# Patient Record
Sex: Female | Born: 1995 | Race: White | Hispanic: No | Marital: Single | Smoking: Current every day smoker
Health system: Southern US, Community
[De-identification: ages and names within clinical notes are randomized; demographics above are authoritative.]

## PROBLEM LIST (undated history)

## (undated) ENCOUNTER — Inpatient Hospital Stay (HOSPITAL_COMMUNITY): Payer: Self-pay

## (undated) DIAGNOSIS — F419 Anxiety disorder, unspecified: Secondary | ICD-10-CM

## (undated) DIAGNOSIS — R51 Headache: Secondary | ICD-10-CM

## (undated) DIAGNOSIS — K219 Gastro-esophageal reflux disease without esophagitis: Secondary | ICD-10-CM

## (undated) DIAGNOSIS — R519 Headache, unspecified: Secondary | ICD-10-CM

## (undated) HISTORY — PX: FRACTURE SURGERY: SHX138

## (undated) HISTORY — DX: Gastro-esophageal reflux disease without esophagitis: K21.9

## (undated) HISTORY — PX: ORTHOPEDIC SURGERY: SHX850

---
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
## 2015-01-01 ENCOUNTER — Encounter (HOSPITAL_COMMUNITY): Payer: Self-pay | Admitting: Emergency Medicine

## 2015-01-01 ENCOUNTER — Emergency Department (INDEPENDENT_AMBULATORY_CARE_PROVIDER_SITE_OTHER)
Admission: EM | Admit: 2015-01-01 | Discharge: 2015-01-01 | Disposition: A | Discharge status: Home / Self Care | Payer: Medicaid Other | Source: Home / Self Care | Attending: Family Medicine | Admitting: Family Medicine

## 2015-01-01 DIAGNOSIS — B86 Scabies: Secondary | ICD-10-CM

## 2015-01-01 MED ORDER — PERMETHRIN 5 % EX CREA: TOPICAL_CREAM | CUTANEOUS | Status: DC

## 2015-01-01 NOTE — Discharge Instructions (Signed)

## 2015-01-01 NOTE — ED Notes (Signed)
C/o rash all over body onset x1 week Denies fevers, chills Reports recent cat in the home A&O x4... No acute distress.

## 2015-01-01 NOTE — ED Provider Notes (Signed)
CSN: [id number]     Arrival date & time 01/01/15  1523 History   First MD Initiated Contact with Patient 01/01/15 1631     Chief Complaint  Patient presents with  . Rash   (Consider location/radiation/quality/duration/timing/severity/associated sxs/prior Treatment) HPI Comments: 19 year old female complaining of a rash for one month. Started when she moved to [place] and was sleeping on an old mattress. It tends to come and go. She takes Zyrtec which helps relieve the itching. The rash, which consist of pinpoint papules which are flesh-colored and red initially occurred on the hand in between the web spaces. They also occurred on the feet, between the toes and then on the arms and legs and torso.  Patient is a 19 y.o. female presenting with rash.  Rash   Past Medical History  Diagnosis Date  . Asthma   . Anxiety   . Headache    Past Surgical History  Procedure Laterality Date  . Orthopedic surgery    . Fracture surgery     No family history on file. Social History  Substance Use Topics  . Smoking status: Current Every Day Smoker -- 1.00 packs/day for 3 years    Types: Cigarettes  . Smokeless tobacco: None  . Alcohol Use: No   OB History    Gravida Para Term Preterm AB TAB SAB Ectopic Multiple Living   0 0 0 0 0 0 0 0       Review of Systems  Constitutional: Negative.   HENT: Negative.   Respiratory: Negative.   Genitourinary: Negative.   Skin: Positive for rash.  Neurological: Negative.     Allergies  Benadryl; Diphenhydramine; and Peanut-containing drug products  Home Medications   Prior to Admission medications   Medication Sig Start Date End Date Taking? Authorizing Provider  Acetaminophen (TYLENOL ARTHRITIS EXT RELIEF PO) Take 2 tablets by mouth daily as needed.    Historical Provider, MD  ciprofloxacin (CIPRO) 500 MG tablet Take 1 tablet (500 mg total) by mouth 2 (two) times daily. 05/13/14   Armando Reichert, CNM  clonazePAM (KLONOPIN) 0.5 MG tablet Take  0.5 mg by mouth daily as needed for anxiety.    Historical Provider, MD  cyclobenzaprine (FLEXERIL) 5 MG tablet Take 1 tablet (5 mg total) by mouth 2 (two) times daily as needed for muscle spasms. 09/15/14   Marlon Pel, PA-C  HYDROcodone-acetaminophen (NORCO/VICODIN) 5-325 MG per tablet Take 1-2 tablets by mouth every 6 (six) hours as needed. 09/18/14   Eliette Laisure, PA-C  naproxen sodium (ANAPROX) 220 MG tablet Take 660 mg by mouth 2 (two) times daily as needed (headaches).    Historical Provider, MD  permethrin (ELIMITE) 5 % cream Apply from head to toe. Leave on for 8 hours then rinse off. Repeat in 1 week. 01/01/15   Hayden Rasmussen, NP  traMADol (ULTRAM) 50 MG tablet Take 1 tablet (50 mg total) by mouth every 6 (six) hours as needed. 09/15/14   Marlon Pel, PA-C   Meds Ordered and Administered this Visit  Medications - No data to display  BP 108/68 mmHg  Pulse 78  Temp(Src) 98.6 F (37 C) (Oral)  SpO2 98%  LMP 12/27/2014 No data found.   Physical Exam  Constitutional: She is oriented to person, place, and time. She appears well-developed and well-nourished. No distress.  HENT:  Mouth/Throat: Oropharynx is clear and moist. No oropharyngeal exudate.  Eyes: Conjunctivae and EOM are normal.  Neck: Normal range of motion. Neck supple.  Cardiovascular: Normal rate.  Pulmonary/Chest: Effort normal and breath sounds normal. No respiratory distress. She has no wheezes.  Musculoskeletal: She exhibits no edema.  Neurological: She is alert and oriented to person, place, and time. She exhibits normal muscle tone.  Skin: Skin is warm and dry. Rash noted.  Rash as described in history of present illness. Consistent with scabies. Although the patient was complaining of hives there are no lesions that are consistent with urticaria.  Psychiatric: She has a normal mood and affect.  Nursing note and vitals reviewed.   ED Course  Procedures (including critical care time)  Labs Review Labs  Reviewed - No data to display  Imaging Review No results found.   Visual Acuity Review  Right Eye Distance:   Left Eye Distance:   Bilateral Distance:    Right Eye Near:   Left Eye Near:    Bilateral Near:         MDM   1. Scabies    Elimite as directed, repeat in 7 days Environmental cleaning and this in to station methods as discussed and per written instructions Follow-up with your PCP as needed.    Hayden Rasmussenavid Raygan Skarda, NP 01/01/15 [phone]

## 2015-01-15 ENCOUNTER — Encounter (HOSPITAL_COMMUNITY): Payer: Self-pay | Admitting: Emergency Medicine

## 2015-01-15 ENCOUNTER — Emergency Department (INDEPENDENT_AMBULATORY_CARE_PROVIDER_SITE_OTHER)
Admission: EM | Admit: 2015-01-15 | Discharge: 2015-01-15 | Disposition: A | Discharge status: Home / Self Care | Payer: Medicaid Other | Source: Home / Self Care

## 2015-01-15 DIAGNOSIS — J039 Acute tonsillitis, unspecified: Secondary | ICD-10-CM

## 2015-01-15 LAB — POCT RAPID STREP A: Streptococcus, Group A Screen (Direct): NEGATIVE

## 2015-01-15 MED ORDER — ACETAMINOPHEN 160 MG/5ML PO SOLN
ORAL | Status: AC
  Filled 2015-01-15: qty 20.3

## 2015-01-15 MED ORDER — AMOXICILLIN 250 MG/5ML PO SUSR: 500.0000 mg | Freq: Two times a day (BID) | ORAL | Status: DC

## 2015-01-15 MED ORDER — AMOXICILLIN 400 MG/5ML PO SUSR: 400.0000 mg | Freq: Three times a day (TID) | ORAL | Status: AC

## 2015-01-15 MED ORDER — ACETAMINOPHEN 160 MG/5ML PO SOLN
15.0000 mg/kg | Freq: Once | ORAL | Status: AC
  Administered 2015-01-15: 650 mg via ORAL

## 2015-01-15 MED ORDER — DEXAMETHASONE 1 MG/ML PO CONC
10.0000 mg | Freq: Once | ORAL | Status: AC
  Administered 2015-01-15: 10 mg via ORAL

## 2015-01-15 MED ORDER — DEXAMETHASONE SODIUM PHOSPHATE 10 MG/ML IJ SOLN
INTRAMUSCULAR | Status: AC
  Filled 2015-01-15: qty 1

## 2015-01-15 NOTE — ED Notes (Signed)
Complains of extremely swollen and painful throat.  Onset this morning of symptoms.  Reports she has extreme pain with swallowing and has to spit saliva

## 2015-01-15 NOTE — Discharge Instructions (Signed)
Tonsillitis Tonsillitis is an infection of the throat. This infection causes the tonsils to become red, tender, and puffy (swollen). Tonsils are groups of tissue at the back of your throat. If bacteria caused your infection, antibiotic medicine will be given to you. Sometimes symptoms of tonsillitis can be relieved with the use of steroid medicine. If your tonsillitis is severe and happens often, you may need to get your tonsils removed (tonsillectomy). HOME CARE   Rest and sleep often.  Drink enough fluids to keep your pee (urine) clear or pale yellow.  While your throat is sore, eat soft or liquid foods like:  Soup.  Ice cream.  Instant breakfast drinks.  Eat frozen ice pops.  Gargle with a warm or cold liquid to help soothe the throat. Gargle with a water and salt mix. Mix 1/4 teaspoon of salt and 1/4 teaspoon of baking soda in 1 cup of water.  Only take medicines as told by your doctor.  If you are given medicines (antibiotics), take them as told. Finish them even if you start to feel better. GET HELP IF:  You have large, tender lumps in your neck.  You have a rash.  You cough up green, yellow-brown, or bloody fluid.  You cannot swallow liquids or food for 24 hours.  You notice that only one of your tonsils is swollen. GET HELP RIGHT AWAY IF:   You throw up (vomit).  You have a very bad headache.  You have a stiff neck.  You have chest pain.  You have trouble breathing or swallowing.  You have bad throat pain, drooling, or your voice changes.  You have bad pain not helped by medicine.  You cannot fully open your mouth.  You have redness, puffiness, or bad pain in the neck.  You have a fever. MAKE SURE YOU:   Understand these instructions.  Will watch your condition.  Will get help right away if you are not doing well or get worse.   This information is not intended to replace advice given to you by your health care provider. Make sure you discuss any  questions you have with your health care provider.   Document Released: 08/15/2007 Document Revised: 03/03/2013 Document Reviewed: 08/15/2012 Elsevier Interactive Patient Education [employer]2016 [employer] Your strep test today is negative.  The steroid medication that you were given tonight will remain in your system for at 3 days. There is no need for a prescription for this medication.  A prescription for Amoxil liquid has been sent to your pharmacy.  Drink lots of fluids.  You may take liquid Tylenol every 4 hours or liquid ibuprofen every 6 hours.

## 2015-01-15 NOTE — ED Provider Notes (Signed)
CSN: [id number]     Arrival date & time 01/15/15  1804 History   None    No chief complaint on file.  (Consider location/radiation/quality/duration/timing/severity/associated sxs/prior Treatment) HPI History obtained from patient:   LOCATION:throat SEVERITY:10 DURATION:1 day CONTEXT:sudden onset  QUALITY:scratchy MODIFYING FACTORS:unable to swallow so no analgesia ASSOCIATED SYMPTOMS:not feeling well, fever TIMING:constant  Past Medical History  Diagnosis Date  . Asthma   . Anxiety   . Headache    Past Surgical History  Procedure Laterality Date  . Orthopedic surgery    . Fracture surgery     No family history on file. Social History  Substance Use Topics  . Smoking status: Current Every Day Smoker -- 1.00 packs/day for 3 years    Types: Cigarettes  . Smokeless tobacco: Not on file  . Alcohol Use: No   OB History    Gravida Para Term Preterm AB TAB SAB Ectopic Multiple Living   0 0 0 0 0 0 0 0       Review of Systems ROS +'ve sore throat  DENIES; CHANGE IN ACTIVITY, CONGESTION, HEADACHE, CHEST PAIN, ABDOMINAL PAIN, SHORTNESS OF BREATH, WHEEZING, EXCESSIVE THIRST OR URINATION, SKIN RASH, DIFFICULTY WITH URINATION, DYSURIA, AGITATION, BALANCE ISSUES   Allergies  Benadryl; Diphenhydramine; and Peanut-containing drug products  Home Medications   Prior to Admission medications   Medication Sig Start Date End Date Taking? Authorizing Provider  Acetaminophen (TYLENOL ARTHRITIS EXT RELIEF PO) Take 2 tablets by mouth daily as needed.    Historical Provider, MD  ciprofloxacin (CIPRO) 500 MG tablet Take 1 tablet (500 mg total) by mouth 2 (two) times daily. 05/13/14   Armando ReichertHeather D Hogan, CNM  clonazePAM (KLONOPIN) 0.5 MG tablet Take 0.5 mg by mouth daily as needed for anxiety.    Historical Provider, MD  cyclobenzaprine (FLEXERIL) 5 MG tablet Take 1 tablet (5 mg total) by mouth 2 (two) times daily as needed for muscle spasms. 09/15/14   Marlon Peliffany Greene, PA-C   HYDROcodone-acetaminophen (NORCO/VICODIN) 5-325 MG per tablet Take 1-2 tablets by mouth every 6 (six) hours as needed. 09/18/14   Cherica Laisure, PA-C  naproxen sodium (ANAPROX) 220 MG tablet Take 660 mg by mouth 2 (two) times daily as needed (headaches).    Historical Provider, MD  permethrin (ELIMITE) 5 % cream Apply from head to toe. Leave on for 8 hours then rinse off. Repeat in 1 week. 01/01/15   Hayden Rasmussenavid Mabe, NP  traMADol (ULTRAM) 50 MG tablet Take 1 tablet (50 mg total) by mouth every 6 (six) hours as needed. 09/15/14   Marlon Peliffany Greene, PA-C   Meds Ordered and Administered this Visit   Medications  acetaminophen (TYLENOL) solution 15 mg/kg (not administered)    LMP 12/27/2014 No data found.   Physical Exam NURSES NOTES AND VITAL SIGNS REVIEWED. CONSTITUTIONAL: Well developed, well nourished, no acute distress HEENT: normocephalic, atraumatic, Throat injected without exudate, TM's clear b/l EYES: Conjunctiva normal NECK:normal ROM, supple PULMONARY:No respiratory distress, normal effort, Lungs: CTAb/l CARDIOVASCULAR: RRR, no murmur ABDOMEN: soft, ND, NT, +'ve BS MUSCULOSKELETAL: Normal ROM of all extremities SKIN: warm and dry without rash PSYCHIATRIC: Mood and affect normal  ED Course  Procedures (including critical care time)  Labs Review Labs Reviewed - No data to display  Imaging Review No results found.   Visual Acuity Review  Right Eye Distance:   Left Eye Distance:   Bilateral Distance:    Right Eye Near:   Left Eye Near:    Bilateral Near:  MDM  No diagnosis found. Treatment options discussed including:RBS  RBS is negative Pt is stable for discharge at this time. Rx (amoxil ) dexamethasone given prior to discharge. Continue symptomatic treatment at home. Advised to follow up with PCP or return to the clinic if there are new or worsening of symptoms.  All questions answered.  Instructions of care provided.       Tharon Aquas,  PA 01/15/15 [phone]

## 2015-01-19 LAB — CULTURE, GROUP A STREP

## 2015-01-28 ENCOUNTER — Emergency Department (HOSPITAL_COMMUNITY)
Admission: EM | Admit: 2015-01-28 | Discharge: 2015-01-28 | Disposition: A | Discharge status: Home / Self Care | Payer: Medicaid Other | Attending: Emergency Medicine | Admitting: Emergency Medicine

## 2015-01-28 ENCOUNTER — Encounter (HOSPITAL_COMMUNITY): Payer: Self-pay | Admitting: Emergency Medicine

## 2015-01-28 DIAGNOSIS — F419 Anxiety disorder, unspecified: Secondary | ICD-10-CM | POA: Insufficient documentation

## 2015-01-28 DIAGNOSIS — Z79899 Other long term (current) drug therapy: Secondary | ICD-10-CM | POA: Diagnosis not present

## 2015-01-28 DIAGNOSIS — D509 Iron deficiency anemia, unspecified: Secondary | ICD-10-CM | POA: Insufficient documentation

## 2015-01-28 DIAGNOSIS — Z3202 Encounter for pregnancy test, result negative: Secondary | ICD-10-CM | POA: Diagnosis not present

## 2015-01-28 DIAGNOSIS — D72829 Elevated white blood cell count, unspecified: Secondary | ICD-10-CM

## 2015-01-28 DIAGNOSIS — J45909 Unspecified asthma, uncomplicated: Secondary | ICD-10-CM | POA: Diagnosis not present

## 2015-01-28 DIAGNOSIS — J029 Acute pharyngitis, unspecified: Secondary | ICD-10-CM | POA: Insufficient documentation

## 2015-01-28 DIAGNOSIS — F17210 Nicotine dependence, cigarettes, uncomplicated: Secondary | ICD-10-CM | POA: Insufficient documentation

## 2015-01-28 LAB — CBC WITH DIFFERENTIAL/PLATELET
BASOS ABS: 0 10*3/uL (ref 0.0–0.1)
Basophils Relative: 0 %
EOS PCT: 0 %
Eosinophils Absolute: 0 10*3/uL (ref 0.0–0.7)
HEMATOCRIT: 30.2 % — AB (ref 36.0–46.0)
Hemoglobin: 9.2 g/dL — ABNORMAL LOW (ref 12.0–15.0)
LYMPHS ABS: 1.3 10*3/uL (ref 0.7–4.0)
Lymphocytes Relative: 6 %
MCH: 24.9 pg — ABNORMAL LOW (ref 26.0–34.0)
MCHC: 30.5 g/dL (ref 30.0–36.0)
MCV: 81.6 fL (ref 78.0–100.0)
MONO ABS: 1.1 10*3/uL — AB (ref 0.1–1.0)
MONOS PCT: 5 %
NEUTROS PCT: 89 %
Neutro Abs: 19.8 10*3/uL — ABNORMAL HIGH (ref 1.7–7.7)
PLATELETS: 300 10*3/uL (ref 150–400)
RBC: 3.7 MIL/uL — AB (ref 3.87–5.11)
RDW: 16.3 % — AB (ref 11.5–15.5)
WBC: 22.2 10*3/uL — AB (ref 4.0–10.5)

## 2015-01-28 LAB — URINE MICROSCOPIC-ADD ON

## 2015-01-28 LAB — RAPID STREP SCREEN (MED CTR MEBANE ONLY): Streptococcus, Group A Screen (Direct): NEGATIVE

## 2015-01-28 LAB — URINALYSIS, ROUTINE W REFLEX MICROSCOPIC
BILIRUBIN URINE: NEGATIVE
Glucose, UA: NEGATIVE mg/dL
Hgb urine dipstick: NEGATIVE
Ketones, ur: NEGATIVE mg/dL
LEUKOCYTES UA: NEGATIVE
NITRITE: NEGATIVE
PH: 7.5 (ref 5.0–8.0)
Protein, ur: NEGATIVE mg/dL
SPECIFIC GRAVITY, URINE: 1.018 (ref 1.005–1.030)

## 2015-01-28 LAB — BASIC METABOLIC PANEL
ANION GAP: 7 (ref 5–15)
BUN: 8 mg/dL (ref 6–20)
CALCIUM: 8.9 mg/dL (ref 8.9–10.3)
CO2: 28 mmol/L (ref 22–32)
Chloride: 104 mmol/L (ref 101–111)
Creatinine, Ser: 0.55 mg/dL (ref 0.44–1.00)
Glucose, Bld: 82 mg/dL (ref 65–99)
POTASSIUM: 3.8 mmol/L (ref 3.5–5.1)
Sodium: 139 mmol/L (ref 135–145)

## 2015-01-28 LAB — POC URINE PREG, ED: PREG TEST UR: NEGATIVE

## 2015-01-28 LAB — MONONUCLEOSIS SCREEN: Mono Screen: NEGATIVE

## 2015-01-28 MED ORDER — CEPHALEXIN 500 MG PO CAPS: 500.0000 mg | ORAL_CAPSULE | Freq: Four times a day (QID) | ORAL | Status: DC

## 2015-01-28 MED ORDER — DEXAMETHASONE SODIUM PHOSPHATE 10 MG/ML IJ SOLN
10.0000 mg | Freq: Once | INTRAMUSCULAR | Status: AC
  Administered 2015-01-28: 10 mg via INTRAMUSCULAR
  Filled 2015-01-28: qty 1

## 2015-01-28 MED ORDER — ACETAMINOPHEN-CODEINE 120-12 MG/5ML PO SUSP: 5.0000 mL | Freq: Four times a day (QID) | ORAL | Status: DC | PRN

## 2015-01-28 NOTE — ED Provider Notes (Signed)
CSN: [id number]     Arrival date & time 01/28/15  1942 History  By signing my name below, I, Evon Slack, attest that this documentation has been prepared under the direction and in the presence of Teressa Lower, NP. Electronically Signed: Evon Slack, ED Scribe. 01/28/2015. 9:07 PM.     Chief Complaint  Patient presents with  . Sore Throat  . Fall    The history is provided by the patient. No language interpreter was used.   HPI Comments: [name] is a 19 y.o. female who presents to the Emergency Department complaining of sore throat onset 2 weeks prior. Pt states that she has also had intermittent fever. Pt states that her pain is worse when swallowing. Pt states that she was recently diagnosed with strep throat. She states that she was prescribed amoxicillin which has not provided any relief. Pt doesn't report drooling, trouble swallowing or SOB.    Past Medical History  Diagnosis Date  . Asthma   . Anxiety   . Headache    Past Surgical History  Procedure Laterality Date  . Orthopedic surgery    . Fracture surgery     No family history on file. Social History  Substance Use Topics  . Smoking status: Current Every Day Smoker -- 0.00 packs/day for 3 years    Types: Cigarettes  . Smokeless tobacco: None  . Alcohol Use: No   OB History    Gravida Para Term Preterm AB TAB SAB Ectopic Multiple Living   0 0 0 0 0 0 0 0        Review of Systems  Constitutional: Positive for fever.  HENT: Positive for sore throat. Negative for drooling and trouble swallowing.   Respiratory: Negative for shortness of breath.   All other systems reviewed and are negative.    Allergies  Benadryl; Diphenhydramine; and Peanut-containing drug products  Home Medications   Prior to Admission medications   Medication Sig Start Date End Date Taking? Authorizing Provider  Acetaminophen (TYLENOL ARTHRITIS EXT RELIEF PO) Take 2 tablets by mouth daily as needed.    Historical  Provider, MD  ciprofloxacin (CIPRO) 500 MG tablet Take 1 tablet (500 mg total) by mouth 2 (two) times daily. Patient not taking: Reported on 01/15/2015 05/13/14   Armando Reichert, CNM  clonazePAM (KLONOPIN) 0.5 MG tablet Take 0.5 mg by mouth daily as needed for anxiety.    Historical Provider, MD  cyclobenzaprine (FLEXERIL) 5 MG tablet Take 1 tablet (5 mg total) by mouth 2 (two) times daily as needed for muscle spasms. Patient not taking: Reported on 01/15/2015 09/15/14   Marlon Pel, PA-C  HYDROcodone-acetaminophen (NORCO/VICODIN) 5-325 MG per tablet Take 1-2 tablets by mouth every 6 (six) hours as needed. Patient not taking: Reported on 01/15/2015 09/18/14   Santiago Glad, PA-C  naproxen sodium (ANAPROX) 220 MG tablet Take 660 mg by mouth 2 (two) times daily as needed (headaches).    Historical Provider, MD  permethrin (ELIMITE) 5 % cream Apply from head to toe. Leave on for 8 hours then rinse off. Repeat in 1 week. Patient not taking: Reported on 01/15/2015 01/01/15   Hayden Rasmussen, NP  traMADol (ULTRAM) 50 MG tablet Take 1 tablet (50 mg total) by mouth every 6 (six) hours as needed. Patient not taking: Reported on 01/15/2015 09/15/14   Marlon Pel, PA-C   BP 111/68 mmHg  Pulse 103  Temp(Src) 99.4 F (37.4 C) (Oral)  Resp 16  Ht  (1.549 m)  Wt 90 lb (40.824 kg)  BMI 17.01 kg/m2  SpO2 99%  LMP 12/04/2014   Physical Exam  Constitutional: She is oriented to person, place, and time. She appears well-developed and well-nourished. No distress.  HENT:  Head: Normocephalic and atraumatic.  Right Ear: External ear normal.  Left Ear: External ear normal.  Mouth/Throat: Oropharyngeal exudate and posterior oropharyngeal erythema present. No tonsillar abscesses.  Eyes: Conjunctivae and EOM are normal.  Neck: Neck supple. No tracheal deviation present.  Cardiovascular: Normal rate.   Pulmonary/Chest: Effort normal. No respiratory distress.  Musculoskeletal: Normal range of motion.   Neurological: She is alert and oriented to person, place, and time.  Skin: Skin is warm and dry.  Psychiatric: She has a normal mood and affect. Her behavior is normal.  Nursing note and vitals reviewed.   ED Course  Procedures (including critical care time) DIAGNOSTIC STUDIES: Oxygen Saturation is 99% on RA, normal by my interpretation.    COORDINATION OF CARE: 9:12 PM-Discussed treatment plan with pt at bedside and pt agreed to plan.     Labs Review Labs Reviewed  URINALYSIS, ROUTINE W REFLEX MICROSCOPIC (NOT AT [facility]) - Abnormal; Notable for the following:    APPearance TURBID (*)    All other components within normal limits  CBC WITH DIFFERENTIAL/PLATELET - Abnormal; Notable for the following:    WBC 22.2 (*)    RBC 3.70 (*)    Hemoglobin 9.2 (*)    HCT 30.2 (*)    MCH 24.9 (*)    RDW 16.3 (*)    All other components within normal limits  URINE MICROSCOPIC-ADD ON - Abnormal; Notable for the following:    Squamous Epithelial / LPF 0-5 (*)    Bacteria, UA MANY (*)    All other components within normal limits  RAPID STREP SCREEN (NOT AT [facility])  CULTURE, GROUP A STREP  BASIC METABOLIC PANEL  POC URINE PREG, ED    Imaging Review No results found.     EKG Interpretation None      MDM   Final diagnoses:  Pharyngitis  Leukocytosis  Iron deficiency anemia    No sign of pta. Treated with no keflex and tylenol with codeine. Pt is okay to follow up with ent as needed  I personally performed the services described in this documentation, which was scribed in my presence. The recorded information has been reviewed and is accurate.      Teressa LowerVrinda Allaina Brotzman, NP 01/28/15 2350  Tilden FossaElizabeth Rees, MD 01/29/15 [phone]

## 2015-01-28 NOTE — ED Notes (Signed)
Pt. reports sore throat with swelling for 2 weeks seen at urgent care diagnosed with tonsillitis prescribed with antibiotic with no improvement , airway intact /respirations unlabored , pt. added dizziness/lightheaded today and fell with no injury.

## 2015-01-28 NOTE — Discharge Instructions (Signed)

## 2015-02-01 LAB — CULTURE, GROUP A STREP: Strep A Culture: NEGATIVE

## 2015-03-10 ENCOUNTER — Emergency Department (HOSPITAL_COMMUNITY)
Admission: EM | Admit: 2015-03-10 | Discharge: 2015-03-10 | Disposition: A | Discharge status: Home / Self Care | Payer: Medicaid Other | Attending: Emergency Medicine | Admitting: Emergency Medicine

## 2015-03-10 ENCOUNTER — Encounter (HOSPITAL_COMMUNITY): Payer: Self-pay | Admitting: *Deleted

## 2015-03-10 DIAGNOSIS — F419 Anxiety disorder, unspecified: Secondary | ICD-10-CM | POA: Insufficient documentation

## 2015-03-10 DIAGNOSIS — J069 Acute upper respiratory infection, unspecified: Secondary | ICD-10-CM | POA: Diagnosis not present

## 2015-03-10 DIAGNOSIS — Z792 Long term (current) use of antibiotics: Secondary | ICD-10-CM | POA: Insufficient documentation

## 2015-03-10 DIAGNOSIS — J45901 Unspecified asthma with (acute) exacerbation: Secondary | ICD-10-CM | POA: Insufficient documentation

## 2015-03-10 DIAGNOSIS — R05 Cough: Secondary | ICD-10-CM | POA: Diagnosis present

## 2015-03-10 DIAGNOSIS — F17210 Nicotine dependence, cigarettes, uncomplicated: Secondary | ICD-10-CM | POA: Diagnosis not present

## 2015-03-10 NOTE — Discharge Instructions (Signed)
[name],  Nice meeting you! Please follow-up with your primary care provider. If you do not have one, please see the following regarding available providers in the area. Return to the emergency department if you develop fevers, neck stiffness, increasing headache. You may take ibuprofen for headaches and facial pain. You can purchase over the counter tessalon perles and/or robitussin cough syrup. Mucinex also works well. Feel better soon!  S. Lane HackerNicole Fredi Hurtado, PA-C   Upper Respiratory Infection, Adult Most upper respiratory infections (URIs) are a viral infection of the air passages leading to the lungs. A URI affects the nose, throat, and upper air passages. The most common type of URI is nasopharyngitis and is typically referred to as "the common cold." URIs run their course and usually go away on their own. Most of the time, a URI does not require medical attention, but sometimes a bacterial infection in the upper airways can follow a viral infection. This is called a secondary infection. Sinus and middle ear infections are common types of secondary upper respiratory infections. Bacterial pneumonia can also complicate a URI. A URI can worsen asthma and chronic obstructive pulmonary disease (COPD). Sometimes, these complications can require emergency medical care and may be life threatening.  CAUSES Almost all URIs are caused by viruses. A virus is a type of germ and can spread from one person to another.  RISKS FACTORS You may be at risk for a URI if:   You smoke.   You have chronic heart or lung disease.  You have a weakened defense (immune) system.   You are very young or very old.   You have nasal allergies or asthma.  You work in crowded or poorly ventilated areas.  You work in health care facilities or schools. SIGNS AND SYMPTOMS  Symptoms typically develop 2-3 days after you come in contact with a cold virus. Most viral URIs last 7-10 days. However, viral URIs from  the influenza virus (flu virus) can last 14-18 days and are typically more severe. Symptoms may include:   Runny or stuffy (congested) nose.   Sneezing.   Cough.   Sore throat.   Headache.   Fatigue.   Fever.   Loss of appetite.   Pain in your forehead, behind your eyes, and over your cheekbones (sinus pain).  Muscle aches.  DIAGNOSIS  Your health care provider may diagnose a URI by:  Physical exam.  Tests to check that your symptoms are not due to another condition such as:  Strep throat.  Sinusitis.  Pneumonia.  Asthma. TREATMENT  A URI goes away on its own with time. It cannot be cured with medicines, but medicines may be prescribed or recommended to relieve symptoms. Medicines may help:  Reduce your fever.  Reduce your cough.  Relieve nasal congestion. HOME CARE INSTRUCTIONS   Take medicines only as directed by your health care provider.   Gargle warm saltwater or take cough drops to comfort your throat as directed by your health care provider.  Use a warm mist humidifier or inhale steam from a shower to increase air moisture. This may make it easier to breathe.  Drink enough fluid to keep your urine clear or pale yellow.   Eat soups and other clear broths and maintain good nutrition.   Rest as needed.   Return to work when your temperature has returned to normal or as your health care provider advises. You may need to stay home longer to avoid infecting others. You can  also use a face mask and careful hand washing to prevent spread of the virus.  Increase the usage of your inhaler if you have asthma.   Do not use any tobacco products, including cigarettes, chewing tobacco, or electronic cigarettes. If you need help quitting, ask your health care provider. PREVENTION  The best way to protect yourself from getting a cold is to practice good hygiene.   Avoid oral or hand contact with people with cold symptoms.   Wash your hands often  if contact occurs.  There is no clear evidence that vitamin C, vitamin E, echinacea, or exercise reduces the chance of developing a cold. However, it is always recommended to get plenty of rest, exercise, and practice good nutrition.  SEEK MEDICAL CARE IF:   You are getting worse rather than better.   Your symptoms are not controlled by medicine.   You have chills.  You have worsening shortness of breath.  You have brown or red mucus.  You have yellow or brown nasal discharge.  You have pain in your face, especially when you bend forward.  You have a fever.  You have swollen neck glands.  You have pain while swallowing.  You have white areas in the back of your throat. SEEK IMMEDIATE MEDICAL CARE IF:   You have severe or persistent:  Headache.  Ear pain.  Sinus pain.  Chest pain.  You have chronic lung disease and any of the following:  Wheezing.  Prolonged cough.  Coughing up blood.  A change in your usual mucus.  You have a stiff neck.  You have changes in your:  Vision.  Hearing.  Thinking.  Mood. MAKE SURE YOU:   Understand these instructions.  Will watch your condition.  Will get help right away if you are not doing well or get worse.   This information is not intended to replace advice given to you by your health care provider. Make sure you discuss any questions you have with your health care provider.   Document Released: 08/22/2000 Document Revised: 07/13/2014 Document Reviewed: 06/03/2013 Elsevier Interactive Patient Education [employer]

## 2015-03-10 NOTE — ED Notes (Signed)
Pt reports having non productive cough and head congestion/headache for several days. Denies n/v/d. No acute distress noted at triage.

## 2015-03-10 NOTE — ED Provider Notes (Signed)
CSN: [id number]     Arrival date & time 03/10/15  1224 History  By signing my name below, I, Tanda RockersMargaux Venter, attest that this documentation has been prepared under the direction and in the presence of Lane HackerNicole Mireyah Chervenak, PA-C. Electronically Signed: Tanda RockersMargaux Venter, ED Scribe. 03/10/2015. 1:20 PM.   Chief Complaint  Patient presents with  . Cough  . Headache   The history is provided by the patient. No language interpreter was used.     HPI Comments: [name] is a 19 y.o. female who presents to the Emergency Department complaining of gradual onset, constant, throbbing, 7/10, diffuse headache x 2 days. Pt also complains of dry cough, shortness of breath, nasal congestion, itchy throat, and a subjective fever. She reports that her shortness of breath is exacerbated with laying flat. Pt has been taking DayQuil and Delsym without relief. Unknown recent sick contact with similar symptoms. Denies ear pain, nausea, vomiting, sore throat, abdominal pain, or any other associated symptoms. Pt is current everyday smoker.   Past Medical History  Diagnosis Date  . Asthma   . Anxiety   . Headache    Past Surgical History  Procedure Laterality Date  . Orthopedic surgery    . Fracture surgery     History reviewed. No pertinent family history. Social History  Substance Use Topics  . Smoking status: Current Every Day Smoker -- 0.00 packs/day for 3 years    Types: Cigarettes  . Smokeless tobacco: None  . Alcohol Use: No   OB History    Gravida Para Term Preterm AB TAB SAB Ectopic Multiple Living   0 0 0 0 0 0 0 0       Review of Systems  Constitutional: Positive for fever (Subjective).  HENT: Positive for congestion. Negative for ear pain and sore throat.        + Itchy throat  Respiratory: Positive for cough and shortness of breath.   Gastrointestinal: Negative for nausea, vomiting and abdominal pain.  All other systems reviewed and are negative.  Allergies  Benadryl; Diphenhydramine;  and Peanut-containing drug products  Home Medications   Prior to Admission medications   Medication Sig Start Date End Date Taking? Authorizing Provider  acetaminophen-codeine 120-12 MG/5ML suspension Take 5 mLs by mouth every 6 (six) hours as needed for pain. 01/28/15   Teressa LowerVrinda Pickering, NP  ALPRAZolam Prudy Feeler(XANAX) 1 MG tablet Take 1 mg by mouth at bedtime as needed for anxiety.    Historical Provider, MD  amoxicillin (AMOXIL) 250 MG/5ML suspension Take 250 mg by mouth 3 (three) times daily.    Historical Provider, MD  cephALEXin (KEFLEX) 500 MG capsule Take 1 capsule (500 mg total) by mouth 4 (four) times daily. 01/28/15   Teressa LowerVrinda Pickering, NP  ciprofloxacin (CIPRO) 500 MG tablet Take 1 tablet (500 mg total) by mouth 2 (two) times daily. Patient not taking: Reported on 01/15/2015 05/13/14   Armando ReichertHeather D Hogan, CNM  cyclobenzaprine (FLEXERIL) 5 MG tablet Take 1 tablet (5 mg total) by mouth 2 (two) times daily as needed for muscle spasms. Patient not taking: Reported on 01/15/2015 09/15/14   Marlon Peliffany Greene, PA-C  HYDROcodone-acetaminophen (NORCO/VICODIN) 5-325 MG per tablet Take 1-2 tablets by mouth every 6 (six) hours as needed. Patient not taking: Reported on 01/15/2015 09/18/14   Santiago GladHeather Laisure, PA-C  permethrin (ELIMITE) 5 % cream Apply from head to toe. Leave on for 8 hours then rinse off. Repeat in 1 week. Patient not taking: Reported on 01/15/2015 01/01/15   Onalee Huaavid  Mabe, NP  traMADol (ULTRAM) 50 MG tablet Take 1 tablet (50 mg total) by mouth every 6 (six) hours as needed. Patient not taking: Reported on 01/15/2015 09/15/14   Marlon Pel, PA-C   Triage Vitals: BP 109/74 mmHg  Pulse 73  Temp(Src) 98.1 F (36.7 C) (Oral)  Resp 16  Ht  (1.549 m)  Wt 93 lb (42.185 kg)  BMI 17.58 kg/m2  SpO2 99%  LMP 02/10/2015   Physical Exam  Constitutional: She is oriented to person, place, and time. She appears well-developed and well-nourished. No distress.  HENT:  Head: Normocephalic and atraumatic.   Right Ear: External ear normal.  Left Ear: External ear normal.  Mouth/Throat: Oropharynx is clear and moist. No oropharyngeal exudate.  Swollen turbinates  Eyes: Conjunctivae and EOM are normal. Pupils are equal, round, and reactive to light. Right eye exhibits no discharge. Left eye exhibits no discharge. No scleral icterus.  Neck: Neck supple. No tracheal deviation present.  Cardiovascular: Normal rate, regular rhythm, normal heart sounds and intact distal pulses.  Exam reveals no gallop and no friction rub.   No murmur heard. Pulmonary/Chest: Effort normal and breath sounds normal. No respiratory distress. She has no wheezes. She has no rales. She exhibits no tenderness.  Abdominal: Soft. Bowel sounds are normal. She exhibits no distension and no mass. There is no tenderness. There is no rebound and no guarding.  Musculoskeletal: Normal range of motion. She exhibits no edema.  Lymphadenopathy:    She has no cervical adenopathy.  Neurological: She is alert and oriented to person, place, and time. Coordination normal.  Skin: Skin is warm and dry. No rash noted. She is not diaphoretic. No erythema.  Psychiatric: She has a normal mood and affect. Her behavior is normal.  Nursing note and vitals reviewed.   ED Course  Procedures (including critical care time)  DIAGNOSTIC STUDIES: Oxygen Saturation is 99% on RA, normal by my interpretation.    COORDINATION OF CARE: 1:17 PM-Discussed treatment plan which includes Rx tessalon pearls and OTC medication with pt at bedside and pt agreed to plan.   MDM   Final diagnoses:  Upper respiratory infection   Pt symptoms consistent with URI. Pt will be discharged with symptomatic treatment.  Discussed return precautions.  Pt is hemodynamically stable & in NAD prior to discharge.  I personally performed the services described in this documentation, which was scribed in my presence. The recorded information has been reviewed and is  accurate.      Melton Krebs, PA-C 03/20/15 1639  Glynn Octave, MD 03/24/15 [phone]

## 2015-07-21 ENCOUNTER — Emergency Department (HOSPITAL_COMMUNITY)
Admission: EM | Admit: 2015-07-21 | Discharge: 2015-07-21 | Disposition: A | Discharge status: Home / Self Care | Payer: Medicaid Other

## 2015-07-21 NOTE — ED Notes (Signed)
Registration asked pt to sit in blue chairs for triage, pt stated "no I'm going outside to smoke a cigarette." Pt walked outside, up the steps towards the employee entrance.

## 2015-10-14 ENCOUNTER — Emergency Department (HOSPITAL_COMMUNITY)
Admission: EM | Admit: 2015-10-14 | Discharge: 2015-10-14 | Disposition: A | Discharge status: Home / Self Care | Payer: Medicaid Other | Attending: Emergency Medicine | Admitting: Emergency Medicine

## 2015-10-14 ENCOUNTER — Encounter (HOSPITAL_COMMUNITY): Payer: Self-pay | Admitting: Emergency Medicine

## 2015-10-14 DIAGNOSIS — J45909 Unspecified asthma, uncomplicated: Secondary | ICD-10-CM | POA: Insufficient documentation

## 2015-10-14 DIAGNOSIS — L03115 Cellulitis of right lower limb: Secondary | ICD-10-CM | POA: Diagnosis not present

## 2015-10-14 DIAGNOSIS — Z79899 Other long term (current) drug therapy: Secondary | ICD-10-CM | POA: Diagnosis not present

## 2015-10-14 DIAGNOSIS — L02415 Cutaneous abscess of right lower limb: Secondary | ICD-10-CM | POA: Diagnosis present

## 2015-10-14 DIAGNOSIS — F17210 Nicotine dependence, cigarettes, uncomplicated: Secondary | ICD-10-CM | POA: Insufficient documentation

## 2015-10-14 DIAGNOSIS — Z91010 Allergy to peanuts: Secondary | ICD-10-CM | POA: Diagnosis not present

## 2015-10-14 LAB — POC URINE PREG, ED: Preg Test, Ur: NEGATIVE

## 2015-10-14 MED ORDER — NAPROXEN 500 MG PO TABS: 500.0000 mg | ORAL_TABLET | Freq: Two times a day (BID) | ORAL | 0 refills | Status: DC

## 2015-10-14 MED ORDER — DOXYCYCLINE HYCLATE 100 MG PO CAPS: 100.0000 mg | ORAL_CAPSULE | Freq: Two times a day (BID) | ORAL | 0 refills | Status: DC

## 2015-10-14 NOTE — ED Triage Notes (Signed)
Pt with small abscess with redness to right knee with pain

## 2015-10-14 NOTE — ED Notes (Signed)
Patient able to ambulate independently  

## 2015-10-14 NOTE — ED Provider Notes (Signed)
[facility]-EMERGENCY DEPT Provider Note   CSN: [id number] Arrival date & time: 10/14/15  1624  First Provider Contact:  None    By signing my name below, I, [name], attest that this documentation has been prepared under the direction and in the presence of Victoria Fowler, PA-C. Electronically Signed: Angelene Giovanni, ED Scribe. 10/14/15. 6:19 PM.   History   Chief Complaint Chief Complaint  Patient presents with  . Abscess    HPI Comments: [name] is a 20 y.o. female who presents to the Emergency Department complaining of gradually worsening moderately painful area of swelling, warmth, and redness to her right knee onset 3 days ago. She notes that the area drained last night. She reports intermittent chills. No alleviating factors noted. Pt has tried Tylenol with no relief. She states that she is currently on her menstrual cycle. She reports an allergy to Bactrim. No fever, generalized rash, or n/v.   The history is provided by the patient. No language interpreter was used.    Past Medical History:  Diagnosis Date  . Anxiety   . Asthma   . Headache     There are no active problems to display for this patient.   Past Surgical History:  Procedure Laterality Date  . FRACTURE SURGERY    . ORTHOPEDIC SURGERY      OB History    Gravida Para Term Preterm AB Living   0 0 0 0 0     SAB TAB Ectopic Multiple Live Births   0 0 0           Home Medications    Prior to Admission medications   Medication Sig Start Date End Date Taking? Authorizing Provider  acetaminophen-codeine 120-12 MG/5ML suspension Take 5 mLs by mouth every 6 (six) hours as needed for pain. 01/28/15   Teressa Lower, NP  ALPRAZolam Prudy Feeler) 1 MG tablet Take 1 mg by mouth at bedtime as needed for anxiety.    Historical Provider, MD  amoxicillin (AMOXIL) 250 MG/5ML suspension Take 250 mg by mouth 3 (three) times daily.    Historical Provider, MD  cephALEXin (KEFLEX) 500 MG capsule Take 1 capsule  (500 mg total) by mouth 4 (four) times daily. 01/28/15   Teressa Lower, NP  ciprofloxacin (CIPRO) 500 MG tablet Take 1 tablet (500 mg total) by mouth 2 (two) times daily. Patient not taking: Reported on 01/15/2015 05/13/14   Armando Reichert, CNM  cyclobenzaprine (FLEXERIL) 5 MG tablet Take 1 tablet (5 mg total) by mouth 2 (two) times daily as needed for muscle spasms. Patient not taking: Reported on 01/15/2015 09/15/14   Marlon Pel, PA-C  doxycycline (VIBRAMYCIN) 100 MG capsule Take 1 capsule (100 mg total) by mouth 2 (two) times daily. 10/14/15   Victoria Fowler, PA-C  HYDROcodone-acetaminophen (NORCO/VICODIN) 5-325 MG per tablet Take 1-2 tablets by mouth every 6 (six) hours as needed. Patient not taking: Reported on 01/15/2015 09/18/14   Santiago Glad, PA-C  naproxen (NAPROSYN) 500 MG tablet Take 1 tablet (500 mg total) by mouth 2 (two) times daily. 10/14/15   Victoria Fowler, PA-C  permethrin (ELIMITE) 5 % cream Apply from head to toe. Leave on for 8 hours then rinse off. Repeat in 1 week. Patient not taking: Reported on 01/15/2015 01/01/15   Hayden Rasmussen, NP  traMADol (ULTRAM) 50 MG tablet Take 1 tablet (50 mg total) by mouth every 6 (six) hours as needed. Patient not taking: Reported on 01/15/2015 09/15/14   Marlon Pel, PA-C  Family History History reviewed. No pertinent family history.  Social History Social History  Substance Use Topics  . Smoking status: Current Every Day Smoker    Packs/day: 0.00    Years: 3.00    Types: Cigarettes  . Smokeless tobacco: Never Used  . Alcohol use No     Allergies   Benadryl [diphenhydramine hcl (sleep)]; Diphenhydramine; and Peanut-containing drug products   Review of Systems Review of Systems  Constitutional: Positive for chills. Negative for fever.  Gastrointestinal: Negative for nausea and vomiting.  Skin: Positive for color change and wound. Negative for rash.     Physical Exam Updated Vital Signs BP 123/81 (BP Location: Left Arm)   Pulse  86   Temp 98.9 F (37.2 C) (Oral)   Resp 18   Wt 41.2 kg   SpO2 100%   BMI 17.18 kg/m   Physical Exam  Constitutional: She is oriented to person, place, and time. She appears well-developed and well-nourished.  HENT:  Head: Normocephalic and atraumatic.  Right Ear: External ear normal.  Left Ear: External ear normal.  Eyes: Conjunctivae are normal. No scleral icterus.  Neck: No tracheal deviation present.  Cardiovascular: Intact distal pulses.   Pulses:      Dorsalis pedis pulses are 2+ on the right side, and 2+ on the left side.  Pulmonary/Chest: Effort normal. No respiratory distress.  Abdominal: She exhibits no distension.  Musculoskeletal: Normal range of motion. She exhibits tenderness.  FAROM of left knee. TTP about erythema.  No effusion noted.   Neurological: She is alert and oriented to person, place, and time.  Normal gait.   Skin: Skin is warm and dry. There is erythema.  4x4 cm area of erythema and warmth.  Minimal swelling.  TTP. No induration or warmth.   Psychiatric: She has a normal mood and affect. Her behavior is normal.     ED Treatments / Results  DIAGNOSTIC STUDIES: Oxygen Saturation is 100% on RA, normal by my interpretation.    COORDINATION OF CARE: 6:15 PM- Pt advised of plan for treatment and pt agrees. She will receive urine preg to determine appropriate antibiotics. Pt will receive Naproxen.    Labs (all labs ordered are listed, but only abnormal results are displayed) Labs Reviewed  POC URINE PREG, ED    Procedures Procedures (including critical care time)  Medications Ordered in ED Medications - No data to display   Initial Impression / Assessment and Plan / ED Course  Victoria Fowler, PA-C has reviewed the triage vital signs and the nursing notes.  Pertinent labs & imaging results that were available during my care of the patient were reviewed by me and considered in my medical decision making (see chart for details).  Clinical Course     Patient presents with findings consistent with cellulitis.  No fluctuance to suggest abscess.  FAROM of left knee.  Doubt septic joint.  No indication for aspiration or drainage.  VSS, NAD.  Ambulatory.  She is not pregnant.  Plan to discharge home with Doxycycline and Naproxen.  Return precautions discussed.  Patient agrees and acknowledges the above plan for discharge.    Final Clinical Impressions(s) / ED Diagnoses   Final diagnoses:  Cellulitis of right lower extremity   I personally performed the services described in this documentation, which was scribed in my presence. The recorded information has been reviewed and is accurate.   New Prescriptions New Prescriptions   DOXYCYCLINE (VIBRAMYCIN) 100 MG CAPSULE    Take 1 capsule (  100 mg total) by mouth 2 (two) times daily.   NAPROXEN (NAPROSYN) 500 MG TABLET    Take 1 tablet (500 mg total) by mouth 2 (two) times daily.     Victoria Fowler, PA-C 10/14/15 1845    Mancel Bale, MD 10/15/15 [phone]

## 2015-11-24 ENCOUNTER — Emergency Department (HOSPITAL_BASED_OUTPATIENT_CLINIC_OR_DEPARTMENT_OTHER)
Admission: EM | Admit: 2015-11-24 | Discharge: 2015-11-24 | Disposition: A | Discharge status: Home / Self Care | Payer: Medicaid Other | Attending: Emergency Medicine | Admitting: Emergency Medicine

## 2015-11-24 ENCOUNTER — Encounter (HOSPITAL_BASED_OUTPATIENT_CLINIC_OR_DEPARTMENT_OTHER): Payer: Self-pay

## 2015-11-24 DIAGNOSIS — Z711 Person with feared health complaint in whom no diagnosis is made: Secondary | ICD-10-CM

## 2015-11-24 DIAGNOSIS — N898 Other specified noninflammatory disorders of vagina: Secondary | ICD-10-CM | POA: Diagnosis present

## 2015-11-24 DIAGNOSIS — J45909 Unspecified asthma, uncomplicated: Secondary | ICD-10-CM | POA: Insufficient documentation

## 2015-11-24 DIAGNOSIS — N390 Urinary tract infection, site not specified: Secondary | ICD-10-CM | POA: Diagnosis not present

## 2015-11-24 DIAGNOSIS — F17210 Nicotine dependence, cigarettes, uncomplicated: Secondary | ICD-10-CM | POA: Insufficient documentation

## 2015-11-24 LAB — URINALYSIS, ROUTINE W REFLEX MICROSCOPIC
Bilirubin Urine: NEGATIVE
GLUCOSE, UA: NEGATIVE mg/dL
Ketones, ur: NEGATIVE mg/dL
Nitrite: POSITIVE — AB
Protein, ur: 30 mg/dL — AB
SPECIFIC GRAVITY, URINE: 1.022 (ref 1.005–1.030)
pH: 6 (ref 5.0–8.0)

## 2015-11-24 LAB — URINE MICROSCOPIC-ADD ON

## 2015-11-24 LAB — PREGNANCY, URINE: Preg Test, Ur: NEGATIVE

## 2015-11-24 MED ORDER — AZITHROMYCIN 250 MG PO TABS
1000.0000 mg | ORAL_TABLET | Freq: Once | ORAL | Status: AC
Start: 2015-11-24 — End: 2015-11-24
  Administered 2015-11-24: 1000 mg via ORAL
  Filled 2015-11-24: qty 4

## 2015-11-24 MED ORDER — CEFTRIAXONE SODIUM 250 MG IJ SOLR
250.0000 mg | Freq: Once | INTRAMUSCULAR | Status: AC
  Administered 2015-11-24: 250 mg via INTRAMUSCULAR
  Filled 2015-11-24: qty 250

## 2015-11-24 MED ORDER — METRONIDAZOLE 500 MG PO TABS
2000.0000 mg | ORAL_TABLET | Freq: Once | ORAL | Status: AC
  Administered 2015-11-24: 2000 mg via ORAL
  Filled 2015-11-24: qty 4

## 2015-11-24 MED ORDER — CEPHALEXIN 500 MG PO CAPS: 500.0000 mg | ORAL_CAPSULE | Freq: Four times a day (QID) | ORAL | 0 refills | Status: DC

## 2015-11-24 NOTE — Discharge Instructions (Signed)
Today were treated for STD exposure. Also take the Keflex for probable urinary tract infection as directed. Would expect improvement over the next few days. Return for any new or worse symptoms.

## 2015-11-24 NOTE — ED Notes (Signed)
MD at bedside. 

## 2015-11-24 NOTE — ED Provider Notes (Signed)
[facility]   CSN: [id number] Arrival date & time: 11/24/15  1555     History   Chief Complaint Chief Complaint  Patient presents with  . Vaginal Discharge    HPI [name] is a 20 y.o. female.  Patient with complaint of vaginal discharge yellow in nature for 2 days. Patient concerned about STD. Patient denies any abdominal pain nausea or vomiting denies any fevers denies any rash. Patient denies any dysuria      Past Medical History:  Diagnosis Date  . Anxiety   . Asthma   . Headache     There are no active problems to display for this patient.   Past Surgical History:  Procedure Laterality Date  . FRACTURE SURGERY    . ORTHOPEDIC SURGERY      OB History    Gravida Para Term Preterm AB Living   0 0 0 0 0     SAB TAB Ectopic Multiple Live Births   0 0 0           Home Medications    Prior to Admission medications   Medication Sig Start Date End Date Taking? Authorizing Provider  acetaminophen-codeine 120-12 MG/5ML suspension Take 5 mLs by mouth every 6 (six) hours as needed for pain. 01/28/15   Teressa LowerVrinda Pickering, NP  ALPRAZolam Prudy Feeler(XANAX) 1 MG tablet Take 1 mg by mouth at bedtime as needed for anxiety.    Historical Provider, MD  amoxicillin (AMOXIL) 250 MG/5ML suspension Take 250 mg by mouth 3 (three) times daily.    Historical Provider, MD  cephALEXin (KEFLEX) 500 MG capsule Take 1 capsule (500 mg total) by mouth 4 (four) times daily. 01/28/15   Teressa LowerVrinda Pickering, NP  cephALEXin (KEFLEX) 500 MG capsule Take 1 capsule (500 mg total) by mouth 4 (four) times daily. 11/24/15   Vanetta MuldersScott Phares Zaccone, MD  ciprofloxacin (CIPRO) 500 MG tablet Take 1 tablet (500 mg total) by mouth 2 (two) times daily. Patient not taking: Reported on 01/15/2015 05/13/14   Armando ReichertHeather D Hogan, CNM  cyclobenzaprine (FLEXERIL) 5 MG tablet Take 1 tablet (5 mg total) by mouth 2 (two) times daily as needed for muscle spasms. Patient not taking: Reported on 01/15/2015  09/15/14   Marlon Peliffany Greene, PA-C  doxycycline (VIBRAMYCIN) 100 MG capsule Take 1 capsule (100 mg total) by mouth 2 (two) times daily. 10/14/15   Cheri FowlerKayla Rose, PA-C  HYDROcodone-acetaminophen (NORCO/VICODIN) 5-325 MG per tablet Take 1-2 tablets by mouth every 6 (six) hours as needed. Patient not taking: Reported on 01/15/2015 09/18/14   Santiago GladHeather Laisure, PA-C  naproxen (NAPROSYN) 500 MG tablet Take 1 tablet (500 mg total) by mouth 2 (two) times daily. 10/14/15   Cheri FowlerKayla Rose, PA-C  permethrin (ELIMITE) 5 % cream Apply from head to toe. Leave on for 8 hours then rinse off. Repeat in 1 week. Patient not taking: Reported on 01/15/2015 01/01/15   Hayden Rasmussenavid Mabe, NP  traMADol (ULTRAM) 50 MG tablet Take 1 tablet (50 mg total) by mouth every 6 (six) hours as needed. Patient not taking: Reported on 01/15/2015 09/15/14   Marlon Peliffany Greene, PA-C    Family History No family history on file.  Social History Social History  Substance Use Topics  . Smoking status: Current Every Day Smoker    Packs/day: 0.00    Years: 3.00    Types: Cigarettes  . Smokeless tobacco: Never Used  . Alcohol use No     Allergies   Benadryl [diphenhydramine hcl (sleep)]; Diphenhydramine; and Peanut-containing  drug products   Review of Systems Review of Systems  Constitutional: Negative for fever.  HENT: Negative for congestion.   Eyes: Negative for redness.  Respiratory: Negative for shortness of breath.   Cardiovascular: Negative for chest pain.  Gastrointestinal: Negative for abdominal pain, nausea and vomiting.  Genitourinary: Positive for vaginal discharge. Negative for dysuria.  Musculoskeletal: Negative for back pain.  Skin: Negative for rash.  Neurological: Negative for headaches.  Hematological: Does not bruise/bleed easily.  Psychiatric/Behavioral: Negative for confusion.     Physical Exam Updated Vital Signs BP 116/87 (BP Location: Left Arm)   Pulse 106   Temp 98.4 F (36.9 C) (Oral)   Resp 18   Ht 5\' 1"  (1.549 m)    Wt 38 kg   LMP  (LMP Unknown)   SpO2 99%   BMI 15.83 kg/m   Physical Exam  Constitutional: She is oriented to person, place, and time. She appears well-developed and well-nourished. No distress.  HENT:  Head: Normocephalic and atraumatic.  Mouth/Throat: Oropharynx is clear and moist.  Eyes: EOM are normal. Pupils are equal, round, and reactive to light.  Neck: Normal range of motion. Neck supple.  Cardiovascular: Normal rate and regular rhythm.   Pulmonary/Chest: Effort normal and breath sounds normal.  Abdominal: Soft. Bowel sounds are normal.  Genitourinary:  Genitourinary Comments: Patient with some mild slight irritation to the posterior aspect of the vaginal introitus. External genitalia without any abnormalities. No skin lesions. Patient not able to tolerate pelvic exam at all could not even get speculum in. No obvious vaginal discharge.  Musculoskeletal: Normal range of motion.  Neurological: She is alert and oriented to person, place, and time. No cranial nerve deficit. She exhibits normal muscle tone. Coordination normal.  Skin: Skin is warm. No rash noted.  Nursing note and vitals reviewed.    ED Treatments / Results  Labs (all labs ordered are listed, but only abnormal results are displayed) Labs Reviewed  URINALYSIS, ROUTINE W REFLEX MICROSCOPIC (NOT AT [facility]) - Abnormal; Notable for the following:       Result Value   APPearance CLOUDY (*)    Hgb urine dipstick TRACE (*)    Protein, ur 30 (*)    Nitrite POSITIVE (*)    Leukocytes, UA LARGE (*)    All other components within normal limits  URINE MICROSCOPIC-ADD ON - Abnormal; Notable for the following:    Squamous Epithelial / LPF 6-30 (*)    Bacteria, UA MANY (*)    All other components within normal limits  WET PREP, GENITAL  URINE CULTURE  PREGNANCY, URINE  RPR  HIV ANTIBODY (ROUTINE TESTING)  GC/CHLAMYDIA PROBE AMP ([place]) NOT AT [facility]    EKG  EKG Interpretation None       Radiology No  results found.  Procedures Procedures (including critical care time)  Medications Ordered in ED Medications  cefTRIAXone (ROCEPHIN) injection 250 mg (not administered)  metroNIDAZOLE (FLAGYL) tablet 2,000 mg (not administered)  azithromycin (ZITHROMAX) tablet 1,000 mg (not administered)     Initial Impression / Assessment and Plan / ED Course  I have reviewed the triage vital signs and the nursing notes.  Pertinent labs & imaging results that were available during my care of the patient were reviewed by me and considered in my medical decision making (see chart for details).  Clinical Course    Patient not able to tolerate pelvic exam done due to pain posteriorly at the introitus of the vagina. No discharge seen no lesions  noted. Patient agreed to being treated empirically had planned on doing cultures and wet prep was unable to obtain that. Patient treated with Rocephin IM and Zithromax and Flagyl. Patient's urinalysis very suggestive urinary tract infection. Patient's urine sent for culture patient will be treated with Keflex for this. Patient did had HIV and RPR drawn.  Discussed with patient even though no lesions were noted that is possible this could be a pre-herpes outbreak. But no clinical proof of that at this time.  Present patient's abdomen is soft and nontender no concern for PID. Patient has no subjective abdominal pain either.  Final Clinical Impressions(s) / ED Diagnoses   Final diagnoses:  Concern about STD in female without diagnosis  UTI (lower urinary tract infection)    New Prescriptions New Prescriptions   CEPHALEXIN (KEFLEX) 500 MG CAPSULE    Take 1 capsule (500 mg total) by mouth 4 (four) times daily.     Vanetta Mulders, MD 11/24/15 1721

## 2015-11-24 NOTE — ED Notes (Signed)
Patient reports that she needs to get home and does not want to wait for the "med. Hold". Patient acknowledges the risks and benefits of leaving without the wait

## 2015-11-24 NOTE — ED Triage Notes (Signed)
C/o vaginal d/c x 2 days-NAD-steady gait 

## 2015-11-25 LAB — HIV ANTIBODY (ROUTINE TESTING W REFLEX): HIV SCREEN 4TH GENERATION: NONREACTIVE

## 2015-11-25 LAB — RPR: RPR: NONREACTIVE

## 2015-11-26 LAB — URINE CULTURE: Culture: >=100000 — AB

## 2015-11-27 ENCOUNTER — Telehealth (HOSPITAL_BASED_OUTPATIENT_CLINIC_OR_DEPARTMENT_OTHER): Payer: Self-pay

## 2015-11-27 NOTE — Telephone Encounter (Signed)
Post ED Visit - Positive Culture Follow-up  Culture report reviewed by antimicrobial stewardship pharmacist:  []  [name], Pharm.D. []  [name], Pharm.D., BCPS []  [name], Pharm.D. []  [name], Pharm.D., BCPS [x]  [name], [address]Ph[address] BCPS, AAHIVP []  [name], Pharm.D., BCPS, AAHIVP []  [name], Pharm.D. []  [name], [address]  Positive urine culture Treated with Cephalexin, organism sensitive to the same and no further patient follow-up is required at this time.  [name], [name] 11/27/2015, 10:03 AM

## 2015-12-24 ENCOUNTER — Emergency Department (HOSPITAL_COMMUNITY)
Admission: EM | Admit: 2015-12-24 | Discharge: 2015-12-24 | Disposition: A | Discharge status: Home / Self Care | Payer: Medicaid Other | Attending: Emergency Medicine | Admitting: Emergency Medicine

## 2015-12-24 ENCOUNTER — Emergency Department (HOSPITAL_COMMUNITY): Payer: Medicaid Other

## 2015-12-24 ENCOUNTER — Encounter (HOSPITAL_COMMUNITY): Payer: Self-pay | Admitting: *Deleted

## 2015-12-24 DIAGNOSIS — Z91010 Allergy to peanuts: Secondary | ICD-10-CM | POA: Diagnosis not present

## 2015-12-24 DIAGNOSIS — F17210 Nicotine dependence, cigarettes, uncomplicated: Secondary | ICD-10-CM | POA: Diagnosis not present

## 2015-12-24 DIAGNOSIS — L03012 Cellulitis of left finger: Secondary | ICD-10-CM | POA: Diagnosis not present

## 2015-12-24 DIAGNOSIS — J45909 Unspecified asthma, uncomplicated: Secondary | ICD-10-CM | POA: Diagnosis not present

## 2015-12-24 DIAGNOSIS — IMO0002 Reserved for concepts with insufficient information to code with codable children: Secondary | ICD-10-CM

## 2015-12-24 DIAGNOSIS — Z79899 Other long term (current) drug therapy: Secondary | ICD-10-CM | POA: Diagnosis not present

## 2015-12-24 DIAGNOSIS — M79645 Pain in left finger(s): Secondary | ICD-10-CM | POA: Diagnosis present

## 2015-12-24 LAB — I-STAT BETA HCG BLOOD, ED (MC, WL, AP ONLY): I-stat hCG, quantitative: <5 m[IU]/mL (ref <5)

## 2015-12-24 LAB — CBC WITH DIFFERENTIAL/PLATELET
Basophils Absolute: 0 10*3/uL (ref 0.0–0.1)
Basophils Relative: 0 %
Eosinophils Absolute: 0 10*3/uL (ref 0.0–0.7)
Eosinophils Relative: 0 %
HCT: 38.7 % (ref 36.0–46.0)
Hemoglobin: 12.1 g/dL (ref 12.0–15.0)
Lymphocytes Relative: 11 %
Lymphs Abs: 1.7 10*3/uL (ref 0.7–4.0)
MCH: 24.4 pg — ABNORMAL LOW (ref 26.0–34.0)
MCHC: 31.3 g/dL (ref 30.0–36.0)
MCV: 78 fL (ref 78.0–100.0)
Monocytes Absolute: 1.3 10*3/uL — ABNORMAL HIGH (ref 0.1–1.0)
Monocytes Relative: 9 %
Neutro Abs: 11.9 10*3/uL — ABNORMAL HIGH (ref 1.7–7.7)
Neutrophils Relative %: 80 %
Platelets: 309 10*3/uL (ref 150–400)
RBC: 4.96 MIL/uL (ref 3.87–5.11)
RDW: 17.2 % — ABNORMAL HIGH (ref 11.5–15.5)
WBC: 14.9 10*3/uL — ABNORMAL HIGH (ref 4.0–10.5)

## 2015-12-24 LAB — I-STAT CG4 LACTIC ACID, ED: Lactic Acid, Venous: 0.63 mmol/L (ref 0.5–1.9)

## 2015-12-24 LAB — BASIC METABOLIC PANEL
Anion gap: 7 (ref 5–15)
BUN: 8 mg/dL (ref 6–20)
CO2: 21 mmol/L — ABNORMAL LOW (ref 22–32)
Calcium: 9 mg/dL (ref 8.9–10.3)
Chloride: 106 mmol/L (ref 101–111)
Creatinine, Ser: 0.6 mg/dL (ref 0.44–1.00)
GFR calc Af Amer: >60 mL/min (ref >60)
GFR calc non Af Amer: >60 mL/min (ref >60)
Glucose, Bld: 111 mg/dL — ABNORMAL HIGH (ref 65–99)
Potassium: 3.9 mmol/L (ref 3.5–5.1)
Sodium: 134 mmol/L — ABNORMAL LOW (ref 135–145)

## 2015-12-24 MED ORDER — VANCOMYCIN HCL 10 G IV SOLR: 2000.0000 mg | Freq: Once | INTRAVENOUS | Status: DC

## 2015-12-24 MED ORDER — LIDOCAINE HCL 2 % IJ SOLN
INTRAMUSCULAR | Status: AC
  Administered 2015-12-24: 400 mg
  Filled 2015-12-24: qty 20

## 2015-12-24 MED ORDER — TETANUS-DIPHTH-ACELL PERTUSSIS 5-2.5-18.5 LF-MCG/0.5 IM SUSP
0.5000 mL | Freq: Once | INTRAMUSCULAR | Status: AC
  Administered 2015-12-24: 0.5 mL via INTRAMUSCULAR
  Filled 2015-12-24: qty 0.5

## 2015-12-24 MED ORDER — LORATADINE 10 MG PO TABS
10.0000 mg | ORAL_TABLET | Freq: Once | ORAL | Status: AC
  Administered 2015-12-24: 10 mg via ORAL
  Filled 2015-12-24: qty 1

## 2015-12-24 MED ORDER — FENTANYL CITRATE (PF) 100 MCG/2ML IJ SOLN
100.0000 ug | Freq: Once | INTRAMUSCULAR | Status: AC
  Administered 2015-12-24: 100 ug via INTRAVENOUS
  Filled 2015-12-24: qty 2

## 2015-12-24 MED ORDER — SODIUM CHLORIDE 0.9 % IV BOLUS (SEPSIS)
1000.0000 mL | Freq: Once | INTRAVENOUS | Status: AC
  Administered 2015-12-24: 1000 mL via INTRAVENOUS

## 2015-12-24 MED ORDER — RANITIDINE HCL 150 MG/10ML PO SYRP
150.0000 mg | ORAL_SOLUTION | Freq: Once | ORAL | Status: AC
  Administered 2015-12-24: 150 mg via ORAL
  Filled 2015-12-24: qty 10

## 2015-12-24 MED ORDER — VANCOMYCIN HCL IN DEXTROSE 1-5 GM/200ML-% IV SOLN
1000.0000 mg | Freq: Once | INTRAVENOUS | Status: AC
  Administered 2015-12-24: 1000 mg via INTRAVENOUS
  Filled 2015-12-24: qty 200

## 2015-12-24 NOTE — ED Notes (Signed)
Pt c/o pain to left hand (thumb), [facility] and was told to follow up with PCP.

## 2015-12-24 NOTE — ED Notes (Signed)
Discharge documentation entered in error

## 2015-12-24 NOTE — ED Notes (Signed)
Pt c/o itching to head, face, neck and arms. PA aware. Vanco d/c'ed.

## 2015-12-24 NOTE — Discharge Instructions (Signed)
Please continue using antibiotics as directed, please return to the emergency room immediately if you experience any new or worsening signs or symptoms. Please follow-up tomorrow morning for reevaluation here in the emergency room. Dr. Orlan Leavensrtman is a Hydrographic surveyorhand surgeon, he is agreed to see you on Monday he will need to make this appointment as well. Please keep your hand elevated.

## 2015-12-24 NOTE — ED Provider Notes (Signed)
[facility] [facility] DEPT Provider Note   CSN: [id number] Arrival date & time: 12/24/15  1242  By signing my name below, I, Maren Reamer, attest that this documentation has been prepared under the direction and in the presence of  [employer], PA-C. Electronically Signed: Maren Reamer, Scribe. 12/24/15. 5:47 PM.     History   Chief Complaint Chief Complaint  Patient presents with  . Hand Injury    HPI Comments: [name] is a 20 y.o. female who presents to the Emergency Department complaining of pain in the left thumb that started 4 days ago. She was seen for the same issue at [facility] and was told to follow up with her PCP, who would then refer her to a hand specialist to have the thumb opened due to infection. Was given pain medication and Doxycycline. Woke up with feelings of a fever this morning as she was very warm and sweaty. Patient reports that she has been expressing purulent drainage from the fingertip. Was started on Doxycycline yesterday and symptoms have mildly improved.    Denies use of IV drugs or any history of using IV drugs.   The history is provided by the patient. No language interpreter was used.  Hand Injury      Past Medical History:  Diagnosis Date  . Anxiety   . Asthma   . Headache     There are no active problems to display for this patient.   Past Surgical History:  Procedure Laterality Date  . FRACTURE SURGERY    . ORTHOPEDIC SURGERY      OB History    Gravida Para Term Preterm AB Living   0 0 0 0 0     SAB TAB Ectopic Multiple Live Births   0 0 0           Home Medications    Prior to Admission medications   Medication Sig Start Date End Date Taking? Authorizing Provider  acetaminophen-codeine 120-12 MG/5ML suspension Take 5 mLs by mouth every 6 (six) hours as needed for pain. 01/28/15   Glendell Docker, NP  ALPRAZolam Duanne Moron) 1 MG tablet Take 1 mg by mouth at bedtime as needed for anxiety.    Historical Provider, MD    amoxicillin (AMOXIL) 250 MG/5ML suspension Take 250 mg by mouth 3 (three) times daily.    Historical Provider, MD  cephALEXin (KEFLEX) 500 MG capsule Take 1 capsule (500 mg total) by mouth 4 (four) times daily. 01/28/15   Glendell Docker, NP  cephALEXin (KEFLEX) 500 MG capsule Take 1 capsule (500 mg total) by mouth 4 (four) times daily. 11/24/15   Fredia Sorrow, MD  ciprofloxacin (CIPRO) 500 MG tablet Take 1 tablet (500 mg total) by mouth 2 (two) times daily. Patient not taking: Reported on 01/15/2015 05/13/14   Tresea Mall, CNM  cyclobenzaprine (FLEXERIL) 5 MG tablet Take 1 tablet (5 mg total) by mouth 2 (two) times daily as needed for muscle spasms. Patient not taking: Reported on 01/15/2015 09/15/14   Delos Haring, PA-C  doxycycline (VIBRAMYCIN) 100 MG capsule Take 1 capsule (100 mg total) by mouth 2 (two) times daily. 10/14/15   Gloriann Loan, PA-C  HYDROcodone-acetaminophen (NORCO/VICODIN) 5-325 MG per tablet Take 1-2 tablets by mouth every 6 (six) hours as needed. Patient not taking: Reported on 01/15/2015 09/18/14   Hyman Bible, PA-C  naproxen (NAPROSYN) 500 MG tablet Take 1 tablet (500 mg total) by mouth 2 (two) times daily. 10/14/15   Gloriann Loan, PA-C  permethrin (  ELIMITE) 5 % cream Apply from head to toe. Leave on for 8 hours then rinse off. Repeat in 1 week. Patient not taking: Reported on 01/15/2015 01/01/15   Janne Napoleon, NP  traMADol (ULTRAM) 50 MG tablet Take 1 tablet (50 mg total) by mouth every 6 (six) hours as needed. Patient not taking: Reported on 01/15/2015 09/15/14   Delos Haring, PA-C    Family History No family history on file.  Social History Social History  Substance Use Topics  . Smoking status: Current Every Day Smoker    Packs/day: 0.00    Years: 3.00    Types: Cigarettes  . Smokeless tobacco: Never Used  . Alcohol use No     Allergies   Benadryl [diphenhydramine hcl (sleep)]; Vancomycin; Diphenhydramine; and Peanut-containing drug products   Review of  Systems Review of Systems  Constitutional:       Fever-like symptoms  Skin:       Pain in left thumb due to infection  All other systems reviewed and are negative.    Physical Exam Updated Vital Signs BP 120/77   Pulse 72   Temp 97.6 F (36.4 C) (Oral)   Resp 16   Wt 38.4 kg   LMP  (LMP Unknown)   SpO2 100%   BMI 15.99 kg/m   Physical Exam  Constitutional: She is oriented to person, place, and time. She appears well-developed and well-nourished.  HENT:  Head: Normocephalic and atraumatic.  Eyes: Conjunctivae are normal. Pupils are equal, round, and reactive to light. Right eye exhibits no discharge. Left eye exhibits no discharge. No scleral icterus.  Neck: Normal range of motion. No JVD present. No tracheal deviation present.  Pulmonary/Chest: Effort normal. No stridor.  Neurological: She is alert and oriented to person, place, and time. Coordination normal.  Skin:  Lymphadenitis on left upper extremity  Psychiatric: She has a normal mood and affect. Her behavior is normal. Judgment and thought content normal.  Nursing note and vitals reviewed.           ED Treatments / Results  Labs (all labs ordered are listed, but only abnormal results are displayed) Labs Reviewed  CBC WITH DIFFERENTIAL/PLATELET - Abnormal; Notable for the following:       Result Value   WBC 14.9 (*)    MCH 24.4 (*)    RDW 17.2 (*)    Neutro Abs 11.9 (*)    Monocytes Absolute 1.3 (*)    All other components within normal limits  BASIC METABOLIC PANEL - Abnormal; Notable for the following:    Sodium 134 (*)    CO2 21 (*)    Glucose, Bld 111 (*)    All other components within normal limits  CULTURE, BLOOD (ROUTINE X 2)  CULTURE, BLOOD (ROUTINE X 2)  AEROBIC/ANAEROBIC CULTURE (SURGICAL/DEEP WOUND)  I-STAT CG4 LACTIC ACID, ED  I-STAT BETA HCG BLOOD, ED ([facility], [facility], [facility] ONLY)  I-STAT CG4 LACTIC ACID, ED    EKG  EKG Interpretation None       Radiology Dg Finger Thumb  Left  Result Date: 12/24/2015 CLINICAL DATA:  Left thumb pain since Tuesday this week. There is swelling and redness of the left thumb and some of the entire left hand. On the posterior aspect of the thumb there is a lot of swelling. EXAM: LEFT THUMB 2+V COMPARISON:  None. FINDINGS: There is no evidence of fracture or dislocation. There is no evidence of arthropathy or other focal bone abnormality. No destructive change to suggest osteomyelitis.  Soft tissues are unremarkable. No soft tissue gas. IMPRESSION: Negative. Electronically Signed   By: Bary Richard M.D.   On: 12/24/2015 14:38    Procedures Procedures (including critical care time)  INCISION AND DRAINAGE Performed by: Thermon Leyland Consent: Verbal consent obtained. Risks and benefits: risks, benefits and alternatives were discussed Type: abscess  Body area: Left thumb  Anesthesia: local infiltration  Incision was made with a scalpel.   digital block: lidocaine 2% 0 epinephrine  Anesthetic total: 4 ml  Complexity: complex Blunt dissection to break up loculations  Drainage: purulent  Drainage amount: .5cc  Packing material: 1/4 in iodoform gauze  Patient tolerance: Patient tolerated the procedure well with no immediate complications.   Medications Ordered in ED Medications  Tdap (BOOSTRIX) injection 0.5 mL (0.5 mLs Intramuscular Given 12/24/15 1449)  sodium chloride 0.9 % bolus 1,000 mL (0 mLs Intravenous Stopped 12/24/15 1738)  lidocaine (XYLOCAINE) 2 % (with pres) injection (400 mg  Given 12/24/15 1451)  fentaNYL (SUBLIMAZE) injection 100 mcg (100 mcg Intravenous Given 12/24/15 1542)  vancomycin (VANCOCIN) IVPB 1000 mg/200 mL premix (0 mg Intravenous Stopped 12/24/15 1716)  ranitidine (ZANTAC) 150 MG/10ML syrup 150 mg (150 mg Oral Given 12/24/15 1730)  loratadine (CLARITIN) tablet 10 mg (10 mg Oral Given 12/24/15 1743)     Initial Impression / Assessment and Plan / ED Course  I have reviewed the triage  vital signs and the nursing notes.  Pertinent labs & imaging results that were available during my care of the patient were reviewed by me and considered in my medical decision making (see chart for details).  Clinical Course  Value Comment By Time  EGFR (African American): >60 (Reviewed) Lennie Muckle 10/14 1631   Labs: Lab workup ordered as per orders list.  Imaging: DG Finger Thumb Left  Consults: Hand Specialist  Therapeutics: IV Saline (sodium chloride 0.9 % bolus 1,000 mL) Tdap injection 0.5 mL.  Vancomycin (VANCOCIN) IVPB 1000 mg/200 mL premix ranitidine (ZANTAC) 150 MG/10ML syrup 150 mg loratadine (CLARITIN) tablet 10 mg   Discharge Meds:  Assessment/Plan:  She presents with a felon to her left thumb. Patient has minor surrounding erythema and redness consistent with cellulitis. Patient has streaking in her proximal forearm. I consult to Dr. Orlan Leavens requesting evaluation, he personally reviewed the images online reporting that this was something that should be managed here in the ED. Instructed to I&D the finger, have patient continue antibiotics, and follow-up as an outpatient at his office. He recommended that patient not be admitted at this time. I spoke to the patient about today's plan, expressing concern for significant infection requiring IV antibiotics and hospital admission. Patient adamantly refused any hospital admission. I&D was performed I was able to express purulent drainage from the wound, wound cultures were performed. Patient reports that symptoms were gradually improving since yesterday, I attempted to give her a dose of vancomycin here in the ED, she began to have hives and itching after that, she was given Zantac and Claritin. Patient reports symptoms subsided after medications. Patient is refusing any further antibiotics here in the ED, is requesting discharge home. Patient is afebrile, with a heart rate is 72, she has a mild elevation of white blood cells, negative  lactic acid, low suspicion for septic type infection. At this time patient will be discharged home with her strict return cautioned. She promised that she would return to the emergency room tomorrow morning for reevaluation, that she would follow up thereafter with Dr. Orlan Leavens in his office.  I expressed my concern for significant infection and need for close follow-up and continued antibiotics. Patient her father both verbalized her understanding and agreement today's plan, should follow-up evaluation, then no further questions or concerns at time of discharge.    I personally performed the services described in this documentation, which was scribed in my presence. The recorded information has been reviewed and is accurate.  Final Clinical Impressions(s) / ED Diagnoses   Final diagnoses:  Felon    New Prescriptions Discharge Medication List as of 12/24/2015  5:41 PM       Okey Regal, PA-C 12/24/15 [place], MD 12/26/15 1827

## 2015-12-26 ENCOUNTER — Encounter (HOSPITAL_COMMUNITY): Payer: Self-pay | Admitting: Emergency Medicine

## 2015-12-26 ENCOUNTER — Emergency Department (HOSPITAL_COMMUNITY)
Admission: EM | Admit: 2015-12-26 | Discharge: 2015-12-26 | Disposition: A | Discharge status: Home / Self Care | Payer: Medicaid Other | Attending: Emergency Medicine | Admitting: Emergency Medicine

## 2015-12-26 DIAGNOSIS — Z4800 Encounter for change or removal of nonsurgical wound dressing: Secondary | ICD-10-CM | POA: Diagnosis not present

## 2015-12-26 DIAGNOSIS — J45909 Unspecified asthma, uncomplicated: Secondary | ICD-10-CM | POA: Insufficient documentation

## 2015-12-26 DIAGNOSIS — Z91010 Allergy to peanuts: Secondary | ICD-10-CM | POA: Diagnosis not present

## 2015-12-26 DIAGNOSIS — L03012 Cellulitis of left finger: Secondary | ICD-10-CM | POA: Diagnosis not present

## 2015-12-26 DIAGNOSIS — F17210 Nicotine dependence, cigarettes, uncomplicated: Secondary | ICD-10-CM | POA: Diagnosis not present

## 2015-12-26 DIAGNOSIS — M79645 Pain in left finger(s): Secondary | ICD-10-CM | POA: Diagnosis present

## 2015-12-26 LAB — CBC WITH DIFFERENTIAL/PLATELET
Basophils Absolute: 0 10*3/uL (ref 0.0–0.1)
Basophils Relative: 0 %
Eosinophils Absolute: 0.3 10*3/uL (ref 0.0–0.7)
Eosinophils Relative: 3 %
HCT: 32.3 % — ABNORMAL LOW (ref 36.0–46.0)
Hemoglobin: 10.2 g/dL — ABNORMAL LOW (ref 12.0–15.0)
Lymphocytes Relative: 25 %
Lymphs Abs: 2.3 10*3/uL (ref 0.7–4.0)
MCH: 23.9 pg — ABNORMAL LOW (ref 26.0–34.0)
MCHC: 31.6 g/dL (ref 30.0–36.0)
MCV: 75.8 fL — ABNORMAL LOW (ref 78.0–100.0)
Monocytes Absolute: 1 10*3/uL (ref 0.1–1.0)
Monocytes Relative: 11 %
Neutro Abs: 5.6 10*3/uL (ref 1.7–7.7)
Neutrophils Relative %: 61 %
Platelets: 275 10*3/uL (ref 150–400)
RBC: 4.26 MIL/uL (ref 3.87–5.11)
RDW: 16.9 % — ABNORMAL HIGH (ref 11.5–15.5)
WBC: 9.3 10*3/uL (ref 4.0–10.5)

## 2015-12-26 LAB — BASIC METABOLIC PANEL
Anion gap: 7 (ref 5–15)
BUN: 13 mg/dL (ref 6–20)
CO2: 25 mmol/L (ref 22–32)
Calcium: 9 mg/dL (ref 8.9–10.3)
Chloride: 106 mmol/L (ref 101–111)
Creatinine, Ser: 0.53 mg/dL (ref 0.44–1.00)
GFR calc Af Amer: >60 mL/min (ref >60)
GFR calc non Af Amer: >60 mL/min (ref >60)
Glucose, Bld: 84 mg/dL (ref 65–99)
Potassium: 4 mmol/L (ref 3.5–5.1)
Sodium: 138 mmol/L (ref 135–145)

## 2015-12-26 MED ORDER — DEXTROSE 5 % IV SOLN
1.0000 g | Freq: Once | INTRAVENOUS | Status: AC
  Administered 2015-12-26: 1 g via INTRAVENOUS
  Filled 2015-12-26: qty 10

## 2015-12-26 MED ORDER — MORPHINE SULFATE (PF) 4 MG/ML IV SOLN
4.0000 mg | Freq: Once | INTRAVENOUS | Status: AC
  Administered 2015-12-26: 4 mg via INTRAVENOUS
  Filled 2015-12-26: qty 1

## 2015-12-26 NOTE — ED Provider Notes (Signed)
[facility]-EMERGENCY DEPT Provider Note   CSN: [id number] Arrival date & time: 12/26/15  0707     History   Chief Complaint Chief Complaint  Patient presents with  . Wound Check    HPI [name] is a 20 y.o. female.  HPI Patient presents to the emergency department with a recheck of her left thumb which she was seen for 2 days ago.  The patient states that the pain has gotten worse and is more swollen.  Patient is not very cooperative during questioning.  She will ignore me and does not answer my questions.  She has her eyes closed.  Her significant other is in the room and will answer some of my questions.  Patient did say that she did not have fevers Past Medical History:  Diagnosis Date  . Anxiety   . Asthma   . Headache     There are no active problems to display for this patient.   Past Surgical History:  Procedure Laterality Date  . FRACTURE SURGERY    . ORTHOPEDIC SURGERY      OB History    Gravida Para Term Preterm AB Living   0 0 0 0 0     SAB TAB Ectopic Multiple Live Births   0 0 0           Home Medications    Prior to Admission medications   Medication Sig Start Date End Date Taking? Authorizing Provider  ibuprofen (ADVIL,MOTRIN) 200 MG tablet Take 800 mg by mouth every 4 (four) hours as needed for fever, headache, mild pain, moderate pain or cramping.   Yes Historical Provider, MD  cephALEXin (KEFLEX) 500 MG capsule Take 1 capsule (500 mg total) by mouth 4 (four) times daily. Patient not taking: Reported on 12/26/2015 11/24/15   Vanetta Mulders, MD  doxycycline (VIBRAMYCIN) 100 MG capsule Take 1 capsule (100 mg total) by mouth 2 (two) times daily. Patient not taking: Reported on 12/26/2015 10/14/15   Cheri Fowler, PA-C  naproxen (NAPROSYN) 500 MG tablet Take 1 tablet (500 mg total) by mouth 2 (two) times daily. Patient not taking: Reported on 12/26/2015 10/14/15   Cheri Fowler, PA-C    Family History No family history on file.  Social  History Social History  Substance Use Topics  . Smoking status: Current Every Day Smoker    Packs/day: 0.00    Years: 3.00    Types: Cigarettes  . Smokeless tobacco: Never Used  . Alcohol use No     Allergies   Benadryl [diphenhydramine hcl (sleep)]; Vancomycin; Diphenhydramine; and Peanut-containing drug products   Review of Systems Review of Systems Level V caveat applies due to uncooperativeness   Physical Exam Updated Vital Signs BP 129/86   Pulse 90   Temp 98.2 F (36.8 C) (Oral)   Resp 16   SpO2 100%   Physical Exam  Constitutional: She appears well-developed and well-nourished.  HENT:  Head: Normocephalic and atraumatic.  Eyes: Pupils are equal, round, and reactive to light.  Cardiovascular: Normal rate and regular rhythm.  Exam reveals no friction rub.   No murmur heard. Pulmonary/Chest: Effort normal and breath sounds normal. No respiratory distress. She has no wheezes.  Musculoskeletal:       Hands: Skin: Skin is warm and dry.  Nursing note and vitals reviewed.    ED Treatments / Results  Labs (all labs ordered are listed, but only abnormal results are displayed) Labs Reviewed  BASIC METABOLIC PANEL  CBC WITH DIFFERENTIAL/PLATELET  EKG  EKG Interpretation None       Radiology Dg Finger Thumb Left  Result Date: 12/24/2015 CLINICAL DATA:  Left thumb pain since Tuesday this week. There is swelling and redness of the left thumb and some of the entire left hand. On the posterior aspect of the thumb there is a lot of swelling. EXAM: LEFT THUMB 2+V COMPARISON:  None. FINDINGS: There is no evidence of fracture or dislocation. There is no evidence of arthropathy or other focal bone abnormality. No destructive change to suggest osteomyelitis. Soft tissues are unremarkable. No soft tissue gas. IMPRESSION: Negative. Electronically Signed   By: Bary RichardStan  Maynard M.D.   On: 12/24/2015 14:38    Procedures Procedures (including critical care  time)  Medications Ordered in ED Medications  morphine 4 MG/ML injection 4 mg (not administered)  cefTRIAXone (ROCEPHIN) 1 g in dextrose 5 % 50 mL IVPB (not administered)     Initial Impression / Assessment and Plan / ED Course  I have reviewed the triage vital signs and the nursing notes.  Pertinent labs & imaging results that were available during my care of the patient were reviewed by me and considered in my medical decision making (see chart for details).  Clinical Course          I spoke with Dr. Melvyn Novasrtmann of hand surgery and he advised to have the patient come to his office at 4 PM I advised the patient to go straight to his office at 4 PM for further evaluation and care of this infectious source in her finger.  Patient agrees the plan and all questions were answered.  I advised patient that if she did not follow up.  This could lead to worsening in her condition.  She did get IV Rocephin while in the emergency department Final Clinical Impressions(s) / ED Diagnoses   Final diagnoses:  None    New Prescriptions New Prescriptions   No medications on file     Charlestine NightChristopher Madhuri Vacca, PA-C 12/26/15 1558    Bethann BerkshireJoseph Zammit, MD 12/28/15 1225

## 2015-12-26 NOTE — ED Notes (Addendum)
[name]- FRIEND- CELL# [phone]. CRYSTAL RN WITNESS PT GIVEN PERMISSION FOR INFORMATION TO BE GIVEN

## 2015-12-26 NOTE — ED Notes (Signed)
PA at bedside.

## 2015-12-26 NOTE — ED Notes (Signed)
MD at bedside. EDP ZAMMIT 

## 2015-12-26 NOTE — ED Triage Notes (Signed)
Pt presents to ED wanting follow up after bing seen here two days ago. Pt had been c/o L thumb pain x 4 days and was feeling feverish and had pus coming from her fingertip. Pt sts "the guy cut open my thumb and told me to come back in two days. My thumb hurts so bad still." Pt has thumb wrapped in same dressing that was applied a couple of days ago. Pt has used up her prescription of pain medication.

## 2015-12-29 LAB — CULTURE, BLOOD (ROUTINE X 2)
Culture: NO GROWTH
Culture: NO GROWTH

## 2015-12-29 LAB — AEROBIC/ANAEROBIC CULTURE (SURGICAL/DEEP WOUND)

## 2015-12-29 LAB — AEROBIC/ANAEROBIC CULTURE W GRAM STAIN (SURGICAL/DEEP WOUND): Special Requests: NORMAL

## 2015-12-30 ENCOUNTER — Telehealth (HOSPITAL_BASED_OUTPATIENT_CLINIC_OR_DEPARTMENT_OTHER): Payer: Self-pay | Admitting: *Deleted

## 2015-12-30 NOTE — Telephone Encounter (Signed)
Post ED Visit - Positive Culture Follow-up  Culture report reviewed by antimicrobial stewardship pharmacist:  []  [name], Victoria.Ochoa. []  [name], Victoria.Ochoa., BCPS []  [name], Victoria.Ochoa. []  [name], Victoria.Ochoa., BCPS []  [name], [address] BCPS, AAHIVP []  [name], Victoria.Ochoa., BCPS, AAHIVP []  [name], [address] []  [name], [address][name], [name]  Positive aerobic/anaerobic culture Treated with Doxycycline, organism sensitive to the same and no further patient follow-up is required at this time.  [name], [name] 12/30/2015, 10:59 AM

## 2016-02-29 ENCOUNTER — Emergency Department (HOSPITAL_BASED_OUTPATIENT_CLINIC_OR_DEPARTMENT_OTHER): Payer: Medicaid Other

## 2016-02-29 ENCOUNTER — Encounter (HOSPITAL_BASED_OUTPATIENT_CLINIC_OR_DEPARTMENT_OTHER): Payer: Self-pay | Admitting: *Deleted

## 2016-02-29 ENCOUNTER — Emergency Department (HOSPITAL_BASED_OUTPATIENT_CLINIC_OR_DEPARTMENT_OTHER)
Admission: EM | Admit: 2016-02-29 | Discharge: 2016-03-01 | Disposition: A | Discharge status: Home / Self Care | Payer: Medicaid Other | Attending: Emergency Medicine | Admitting: Emergency Medicine

## 2016-02-29 DIAGNOSIS — Z5181 Encounter for therapeutic drug level monitoring: Secondary | ICD-10-CM | POA: Insufficient documentation

## 2016-02-29 DIAGNOSIS — Z91010 Allergy to peanuts: Secondary | ICD-10-CM | POA: Insufficient documentation

## 2016-02-29 DIAGNOSIS — L03211 Cellulitis of face: Secondary | ICD-10-CM | POA: Insufficient documentation

## 2016-02-29 DIAGNOSIS — F1290 Cannabis use, unspecified, uncomplicated: Secondary | ICD-10-CM | POA: Diagnosis not present

## 2016-02-29 DIAGNOSIS — F1910 Other psychoactive substance abuse, uncomplicated: Secondary | ICD-10-CM | POA: Insufficient documentation

## 2016-02-29 DIAGNOSIS — J45909 Unspecified asthma, uncomplicated: Secondary | ICD-10-CM | POA: Insufficient documentation

## 2016-02-29 DIAGNOSIS — F17210 Nicotine dependence, cigarettes, uncomplicated: Secondary | ICD-10-CM | POA: Insufficient documentation

## 2016-02-29 DIAGNOSIS — R51 Headache: Secondary | ICD-10-CM | POA: Diagnosis present

## 2016-02-29 LAB — COMPREHENSIVE METABOLIC PANEL
ALK PHOS: 53 U/L (ref 38–126)
ALT: 12 U/L — ABNORMAL LOW (ref 14–54)
ANION GAP: 9 (ref 5–15)
AST: 22 U/L (ref 15–41)
Albumin: 4 g/dL (ref 3.5–5.0)
BILIRUBIN TOTAL: 0.5 mg/dL (ref 0.3–1.2)
BUN: 11 mg/dL (ref 6–20)
CALCIUM: 8.8 mg/dL — AB (ref 8.9–10.3)
CO2: 23 mmol/L (ref 22–32)
Chloride: 101 mmol/L (ref 101–111)
Creatinine, Ser: 0.54 mg/dL (ref 0.44–1.00)
GFR calc non Af Amer: >60 mL/min (ref >60)
Glucose, Bld: 140 mg/dL — ABNORMAL HIGH (ref 65–99)
POTASSIUM: 3.6 mmol/L (ref 3.5–5.1)
Sodium: 133 mmol/L — ABNORMAL LOW (ref 135–145)
TOTAL PROTEIN: 7 g/dL (ref 6.5–8.1)

## 2016-02-29 LAB — CBC WITH DIFFERENTIAL/PLATELET
BASOS PCT: 0 %
Basophils Absolute: 0 10*3/uL (ref 0.0–0.1)
EOS ABS: 0.3 10*3/uL (ref 0.0–0.7)
EOS PCT: 1 %
HEMATOCRIT: 34.2 % — AB (ref 36.0–46.0)
HEMOGLOBIN: 10.8 g/dL — AB (ref 12.0–15.0)
Lymphocytes Relative: 5 %
Lymphs Abs: 1.4 10*3/uL (ref 0.7–4.0)
MCH: 24.9 pg — AB (ref 26.0–34.0)
MCHC: 31.6 g/dL (ref 30.0–36.0)
MCV: 78.8 fL (ref 78.0–100.0)
MONO ABS: 1.7 10*3/uL — AB (ref 0.1–1.0)
Monocytes Relative: 6 %
NEUTROS ABS: 24.1 10*3/uL — AB (ref 1.7–7.7)
NEUTROS PCT: 88 %
Platelets: 290 10*3/uL (ref 150–400)
RBC: 4.34 MIL/uL (ref 3.87–5.11)
RDW: 17.8 % — AB (ref 11.5–15.5)
WBC: 27.5 10*3/uL — ABNORMAL HIGH (ref 4.0–10.5)

## 2016-02-29 LAB — I-STAT CG4 LACTIC ACID, ED: Lactic Acid, Venous: 1.67 mmol/L (ref 0.5–1.9)

## 2016-02-29 MED ORDER — SODIUM CHLORIDE 0.9 % IV BOLUS (SEPSIS)
1000.0000 mL | Freq: Once | INTRAVENOUS | Status: AC
  Administered 2016-02-29: 1000 mL via INTRAVENOUS

## 2016-02-29 MED ORDER — IOPAMIDOL (ISOVUE-300) INJECTION 61%
80.0000 mL | Freq: Once | INTRAVENOUS | Status: AC | PRN
  Administered 2016-03-01: 80 mL via INTRAVENOUS

## 2016-02-29 MED ORDER — ACETAMINOPHEN 500 MG PO TABS
1000.0000 mg | ORAL_TABLET | Freq: Once | ORAL | Status: AC
  Administered 2016-02-29: 1000 mg via ORAL
  Filled 2016-02-29: qty 2

## 2016-02-29 NOTE — ED Triage Notes (Signed)
Pt c/o left cheek and jaw swelling x 2 days

## 2016-02-29 NOTE — ED Provider Notes (Addendum)
[facility] Provider Note: Victoria Dell, MD, FACEP  CSN: [id number] MRN: [id number] ARRIVAL: 02/29/16 at 2218 ROOM: [facility]   CHIEF COMPLAINT  Facial Swelling   HISTORY OF PRESENT ILLNESS  [name] is a 20 y.o. female complaining of moderate to severe left cheek pain x 2 days. The pt complains that her facial pain is constant and gradually worsening. She believes the pain may be secondary to an unknown pest bite or dental Etiology although she reports no toothache. Pt reports associated subjective fever, facial swelling, sore throat and dental pain. Pt repeatedly insists she needs to eat. She denies chills, N/V/D, body aches and ear pain.   Past Medical History:  Diagnosis Date  . Anxiety   . Asthma   . Headache     Past Surgical History:  Procedure Laterality Date  . FRACTURE SURGERY    . ORTHOPEDIC SURGERY      History reviewed. No pertinent family history.  Social History  Substance Use Topics  . Smoking status: Current Every Day Smoker    Packs/day: 1.00    Years: 3.00    Types: Cigarettes  . Smokeless tobacco: Never Used  . Alcohol use No    Prior to Admission medications   Medication Sig Start Date End Date Taking? Authorizing Provider  ibuprofen (ADVIL,MOTRIN) 200 MG tablet Take 800 mg by mouth every 4 (four) hours as needed for fever, headache, mild pain, moderate pain or cramping.    Historical Provider, MD    Allergies Benadryl [diphenhydramine hcl (sleep)]; Vancomycin; Diphenhydramine; and Peanut-containing drug products   REVIEW OF SYSTEMS  Negative except as noted here or in the History of Present Illness.   PHYSICAL EXAMINATION  Initial Vital Signs Blood pressure 124/81, pulse (!) 135, temperature 100.3 F (37.9 C), resp. rate 16, weight 90 lb 8 oz (41.1 kg), last menstrual period 02/10/2016, SpO2 100 %.  Examination General: Well-developed, well-nourished female in no acute distress; appearance consistent with age of  record HENT: Swelling, tenderness and induration of the left cheek without fluctuance or erythema; TMs normal Eyes: pupils equal, round and reactive to light; extraocular muscles intact Neck: supple; left anterior cervical lymphadenopathy Heart: regular rate and rhythm; tachycardia Lungs: clear to auscultation bilaterally Abdomen: soft; nondistended; nontender; no masses or hepatosplenomegaly; bowel sounds present Extremities: No deformity; full range of motion; pulses normal Neurologic: Awake, alert and oriented; motor function intact in all extremities and symmetric; no facial droop Skin: Warm and dry Psychiatric: Flat affect   RESULTS  Summary of this visit's results, reviewed by myself:   EKG Interpretation  Date/Time:    Ventricular Rate:    PR Interval:    QRS Duration:   QT Interval:    QTC Calculation:   R Axis:     Text Interpretation:        Laboratory Studies: Results for orders placed or performed during the hospital encounter of 02/29/16 (from the past 24 hour(s))  CBC with Differential     Status: Abnormal   Collection Time: 02/29/16 11:00 PM  Result Value Ref Range   WBC 27.5 (H) 4.0 - 10.5 K/uL   RBC 4.34 3.87 - 5.11 MIL/uL   Hemoglobin 10.8 (L) 12.0 - 15.0 g/dL   HCT 78.2 (L) 95.6 - 21.3 %   MCV 78.8 78.0 - 100.0 fL   MCH 24.9 (L) 26.0 - 34.0 pg   MCHC 31.6 30.0 - 36.0 g/dL   RDW 08.6 (H) 57.8 - 46.9 %   Platelets  290 150 - 400 K/uL   Neutrophils Relative % 88 %   Lymphocytes Relative 5 %   Monocytes Relative 6 %   Eosinophils Relative 1 %   Basophils Relative 0 %   Neutro Abs 24.1 (H) 1.7 - 7.7 K/uL   Lymphs Abs 1.4 0.7 - 4.0 K/uL   Monocytes Absolute 1.7 (H) 0.1 - 1.0 K/uL   Eosinophils Absolute 0.3 0.0 - 0.7 K/uL   Basophils Absolute 0.0 0.0 - 0.1 K/uL  Comprehensive metabolic panel     Status: Abnormal   Collection Time: 02/29/16 11:00 PM  Result Value Ref Range   Sodium 133 (L) 135 - 145 mmol/L   Potassium 3.6 3.5 - 5.1 mmol/L   Chloride  101 101 - 111 mmol/L   CO2 23 22 - 32 mmol/L   Glucose, Bld 140 (H) 65 - 99 mg/dL   BUN 11 6 - 20 mg/dL   Creatinine, Ser 1.610.54 0.44 - 1.00 mg/dL   Calcium 8.8 (L) 8.9 - 10.3 mg/dL   Total Protein 7.0 6.5 - 8.1 g/dL   Albumin 4.0 3.5 - 5.0 g/dL   AST 22 15 - 41 U/L   ALT 12 (L) 14 - 54 U/L   Alkaline Phosphatase 53 38 - 126 U/L   Total Bilirubin 0.5 0.3 - 1.2 mg/dL   GFR calc non Af Amer >60 >60 mL/min   GFR calc Af Amer >60 >60 mL/min   Anion gap 9 5 - 15  I-Stat CG4 Lactic Acid, ED     Status: None   Collection Time: 02/29/16 11:12 PM  Result Value Ref Range   Lactic Acid, Venous 1.67 0.5 - 1.9 mmol/L  Pregnancy, urine     Status: None   Collection Time: 03/01/16 12:35 AM  Result Value Ref Range   Preg Test, Ur NEGATIVE NEGATIVE  Rapid urine drug screen (hospital performed)     Status: Abnormal   Collection Time: 03/01/16 12:35 AM  Result Value Ref Range   Opiates NONE DETECTED NONE DETECTED   Cocaine POSITIVE (A) NONE DETECTED   Benzodiazepines NONE DETECTED NONE DETECTED   Amphetamines NONE DETECTED NONE DETECTED   Tetrahydrocannabinol POSITIVE (A) NONE DETECTED   Barbiturates NONE DETECTED NONE DETECTED  Urinalysis, Routine w reflex microscopic     Status: Abnormal   Collection Time: 03/01/16 12:35 AM  Result Value Ref Range   Color, Urine YELLOW YELLOW   APPearance CLOUDY (A) CLEAR   Specific Gravity, Urine 1.026 1.005 - 1.030   pH 5.5 5.0 - 8.0   Glucose, UA NEGATIVE NEGATIVE mg/dL   Hgb urine dipstick NEGATIVE NEGATIVE   Bilirubin Urine NEGATIVE NEGATIVE   Ketones, ur NEGATIVE NEGATIVE mg/dL   Protein, ur NEGATIVE NEGATIVE mg/dL   Nitrite NEGATIVE NEGATIVE   Leukocytes, UA NEGATIVE NEGATIVE  I-Stat CG4 Lactic Acid, ED     Status: None   Collection Time: 03/01/16  2:04 AM  Result Value Ref Range   Lactic Acid, Venous 0.53 0.5 - 1.9 mmol/L   Imaging Studies: Ct Maxillofacial W Contrast  Result Date: 03/01/2016 CLINICAL DATA:  20 year old female with left  facial swelling x2 days. Leukocytosis and fever. Sore throat and dental pain. No trauma or surgery. EXAM: CT MAXILLOFACIAL WITH CONTRAST TECHNIQUE: Multidetector CT imaging of the maxillofacial structures was performed. Multiplanar CT image reconstructions were also generated. A small metallic BB was placed on the right temple in order to reliably differentiate right from left. COMPARISON:  Head CT dated 07/07/2013 FINDINGS: Osseous: No  fracture or mandibular dislocation. No destructive process. Orbits: Negative. No traumatic or inflammatory finding. Sinuses: There is opacification of the left nasal passages. The remainder of the visualized paranasal sinuses and mastoid air cells are clear. There is enlargement of the adenoid tissue in the posterior nasopharynx. Soft tissues: There is mild diffuse edema and subcutaneous soft tissue stranding of the left side of the face. No drainable fluid collection or abscess identified. There is mild enlargement of the tonsils. Clinical correlation is recommended to evaluate for tonsillitis. No pre tonsillar abscess. Limited intracranial: No significant or unexpected finding. IMPRESSION: Inflammatory changes of the soft tissues of the left side of the face may represent cellulitis. No drainable fluid collection or abscess. Opacification of the left nasal passages. Mild tonsillar enlargement.  No peritonsillar abscess. Electronically Signed   By: Elgie CollardArash  Radparvar M.D.   On: 03/01/2016 00:53    ED COURSE  Will order imaging. Nursing notes and initial vitals signs, including pulse oximetry, reviewed.  Vitals:   02/29/16 2230 03/01/16 0111 03/01/16 0200 03/01/16 0317  BP: 124/81 97/60  92/62  Pulse: (!) 135 84  86  Resp: 16 16  16   Temp: 100.3 F (37.9 C) 98.7 F (37.1 C) 98.9 F (37.2 C) 98.7 F (37.1 C)  TempSrc:  Oral Oral Oral  SpO2: 100% 99%  98%  Weight:       3:50 AM Patient's repeat lactic acid has improved. Tachycardia has resolved. She does not meet  SIRS criteria. She was given 600 milligrams of clindamycin IV in the ED and we will discharge her home on clindamycin orally. We'll have her return in 12 hours for a recheck.    PROCEDURES    ED DIAGNOSES     ICD-9-CM ICD-10-CM   1. Facial cellulitis 682.0 L03.211   2. Polysubstance abuse 305.90 F19.10     I personally performed the services described in this documentation, which was scribed in my presence. The recorded information has been reviewed and is accurate.    Paula LibraJohn Xiomara Sevillano, MD 03/01/16 [id number]    Paula LibraJohn Cameren Odwyer, MD 03/01/16 [phone]

## 2016-02-29 NOTE — ED Notes (Signed)
Patient transported to CT 

## 2016-02-29 NOTE — ED Notes (Signed)
ED Provider at bedside. 

## 2016-03-01 LAB — URINALYSIS, ROUTINE W REFLEX MICROSCOPIC
Bilirubin Urine: NEGATIVE
Glucose, UA: NEGATIVE mg/dL
HGB URINE DIPSTICK: NEGATIVE
KETONES UR: NEGATIVE mg/dL
LEUKOCYTES UA: NEGATIVE
Nitrite: NEGATIVE
PROTEIN: NEGATIVE mg/dL
Specific Gravity, Urine: 1.026 (ref 1.005–1.030)
pH: 5.5 (ref 5.0–8.0)

## 2016-03-01 LAB — RAPID URINE DRUG SCREEN, HOSP PERFORMED
Amphetamines: NOT DETECTED
BARBITURATES: NOT DETECTED
BENZODIAZEPINES: NOT DETECTED
Cocaine: POSITIVE — AB
Opiates: NOT DETECTED
TETRAHYDROCANNABINOL: POSITIVE — AB

## 2016-03-01 LAB — PREGNANCY, URINE: Preg Test, Ur: NEGATIVE

## 2016-03-01 LAB — I-STAT CG4 LACTIC ACID, ED: LACTIC ACID, VENOUS: 0.53 mmol/L (ref 0.5–1.9)

## 2016-03-01 MED ORDER — CLINDAMYCIN HCL 300 MG PO CAPS: 300.0000 mg | ORAL_CAPSULE | Freq: Four times a day (QID) | ORAL | 0 refills | Status: DC

## 2016-03-01 MED ORDER — CLINDAMYCIN PHOSPHATE 600 MG/50ML IV SOLN
600.0000 mg | Freq: Once | INTRAVENOUS | Status: AC
  Administered 2016-03-01: 600 mg via INTRAVENOUS
  Filled 2016-03-01: qty 50

## 2016-03-01 MED ORDER — FLUCONAZOLE 150 MG PO TABS: ORAL_TABLET | ORAL | 0 refills | Status: DC

## 2016-03-01 NOTE — ED Notes (Signed)
amb to BR 

## 2016-03-01 NOTE — ED Notes (Signed)
Pt verbalizes understanding of d/c instructions and denies any further needs at this time.  She understands to return for a recheck of her face this afternoon as well.  Pt ambulatory to waiting room to wait for ride.

## 2016-03-01 NOTE — ED Notes (Signed)
Pt is very sleepy, does not appear to be in pain.  She is eating peanut butter and crackers, tolerating PO fluids.

## 2016-03-06 LAB — CULTURE, BLOOD (ROUTINE X 2)
Culture: NO GROWTH
Culture: NO GROWTH

## 2016-05-10 ENCOUNTER — Encounter (HOSPITAL_COMMUNITY): Payer: Self-pay

## 2016-05-10 ENCOUNTER — Emergency Department (HOSPITAL_COMMUNITY)
Admission: EM | Admit: 2016-05-10 | Discharge: 2016-05-10 | Disposition: A | Discharge status: Home / Self Care | Payer: Medicaid Other | Attending: Emergency Medicine | Admitting: Emergency Medicine

## 2016-05-10 ENCOUNTER — Emergency Department (HOSPITAL_COMMUNITY): Payer: Medicaid Other

## 2016-05-10 DIAGNOSIS — L03213 Periorbital cellulitis: Secondary | ICD-10-CM

## 2016-05-10 DIAGNOSIS — Z91010 Allergy to peanuts: Secondary | ICD-10-CM | POA: Diagnosis not present

## 2016-05-10 DIAGNOSIS — J45909 Unspecified asthma, uncomplicated: Secondary | ICD-10-CM | POA: Insufficient documentation

## 2016-05-10 DIAGNOSIS — L0201 Cutaneous abscess of face: Secondary | ICD-10-CM | POA: Diagnosis not present

## 2016-05-10 DIAGNOSIS — F17210 Nicotine dependence, cigarettes, uncomplicated: Secondary | ICD-10-CM | POA: Diagnosis not present

## 2016-05-10 DIAGNOSIS — Z79899 Other long term (current) drug therapy: Secondary | ICD-10-CM | POA: Diagnosis not present

## 2016-05-10 DIAGNOSIS — R220 Localized swelling, mass and lump, head: Secondary | ICD-10-CM | POA: Diagnosis present

## 2016-05-10 DIAGNOSIS — L0291 Cutaneous abscess, unspecified: Secondary | ICD-10-CM

## 2016-05-10 LAB — BASIC METABOLIC PANEL
Anion gap: 6 (ref 5–15)
BUN: 11 mg/dL (ref 6–20)
CO2: 24 mmol/L (ref 22–32)
Calcium: 8.6 mg/dL — ABNORMAL LOW (ref 8.9–10.3)
Chloride: 107 mmol/L (ref 101–111)
Creatinine, Ser: 0.53 mg/dL (ref 0.44–1.00)
GFR calc Af Amer: >60 mL/min (ref >60)
GFR calc non Af Amer: >60 mL/min (ref >60)
Glucose, Bld: 94 mg/dL (ref 65–99)
Potassium: 3.6 mmol/L (ref 3.5–5.1)
Sodium: 137 mmol/L (ref 135–145)

## 2016-05-10 LAB — CBC WITH DIFFERENTIAL/PLATELET
Basophils Absolute: 0 10*3/uL (ref 0.0–0.1)
Basophils Relative: 0 %
Eosinophils Absolute: 0.2 10*3/uL (ref 0.0–0.7)
Eosinophils Relative: 1 %
HCT: 31.1 % — ABNORMAL LOW (ref 36.0–46.0)
Hemoglobin: 9.5 g/dL — ABNORMAL LOW (ref 12.0–15.0)
Lymphocytes Relative: 10 %
Lymphs Abs: 1.9 10*3/uL (ref 0.7–4.0)
MCH: 23.8 pg — ABNORMAL LOW (ref 26.0–34.0)
MCHC: 30.5 g/dL (ref 30.0–36.0)
MCV: 77.9 fL — ABNORMAL LOW (ref 78.0–100.0)
Monocytes Absolute: 1.8 10*3/uL — ABNORMAL HIGH (ref 0.1–1.0)
Monocytes Relative: 10 %
Neutro Abs: 15.1 10*3/uL — ABNORMAL HIGH (ref 1.7–7.7)
Neutrophils Relative %: 79 %
Platelets: 184 10*3/uL (ref 150–400)
RBC: 3.99 MIL/uL (ref 3.87–5.11)
RDW: 18.2 % — ABNORMAL HIGH (ref 11.5–15.5)
WBC: 19.1 10*3/uL — ABNORMAL HIGH (ref 4.0–10.5)

## 2016-05-10 MED ORDER — IOPAMIDOL (ISOVUE-300) INJECTION 61%
INTRAVENOUS | Status: AC
  Filled 2016-05-10: qty 75

## 2016-05-10 MED ORDER — SODIUM CHLORIDE 0.9 % IV BOLUS (SEPSIS)
1000.0000 mL | Freq: Once | INTRAVENOUS | Status: AC
  Administered 2016-05-10: 1000 mL via INTRAVENOUS

## 2016-05-10 MED ORDER — OXYCODONE-ACETAMINOPHEN 5-325 MG PO TABS
1.0000 | ORAL_TABLET | Freq: Once | ORAL | Status: AC
  Administered 2016-05-10: 1 via ORAL
  Filled 2016-05-10: qty 1

## 2016-05-10 MED ORDER — CLINDAMYCIN HCL 300 MG PO CAPS: 300.0000 mg | ORAL_CAPSULE | Freq: Three times a day (TID) | ORAL | 0 refills | Status: DC

## 2016-05-10 MED ORDER — IOPAMIDOL (ISOVUE-300) INJECTION 61%
75.0000 mL | Freq: Once | INTRAVENOUS | Status: AC | PRN
  Administered 2016-05-10: 75 mL via INTRAVENOUS

## 2016-05-10 MED ORDER — TRAMADOL HCL 50 MG PO TABS: 50.0000 mg | ORAL_TABLET | Freq: Four times a day (QID) | ORAL | 0 refills | Status: DC | PRN

## 2016-05-10 MED ORDER — LIDOCAINE HCL 2 % IJ SOLN
20.0000 mL | Freq: Once | INTRAMUSCULAR | Status: AC
  Administered 2016-05-10: 400 mg
  Filled 2016-05-10: qty 20

## 2016-05-10 MED ORDER — ACETAMINOPHEN 325 MG PO TABS
650.0000 mg | ORAL_TABLET | Freq: Once | ORAL | Status: AC
  Administered 2016-05-10: 650 mg via ORAL
  Filled 2016-05-10: qty 2

## 2016-05-10 MED ORDER — CLINDAMYCIN PHOSPHATE 600 MG/50ML IV SOLN
600.0000 mg | Freq: Once | INTRAVENOUS | Status: AC
  Administered 2016-05-10: 600 mg via INTRAVENOUS
  Filled 2016-05-10: qty 50

## 2016-05-10 NOTE — ED Provider Notes (Signed)
[facility]-EMERGENCY DEPT Provider Note   CSN: [id number] Arrival date & time: 05/10/16  1338     History   Chief Complaint Chief Complaint  Patient presents with  . Facial Pain  . Facial Swelling    HPI [name] is a 21 y.o. female.  HPI   21 year old female presents today with facial swelling.  Patient reports symptoms started 2 days ago after popping a pimple on the right side of her face.  She notes surrounding redness, discharge, and swelling up into the eye.  She reports it is painful when she moves her eyes.  She denies any fever at home, denies any ocular discharge.  Patient denies any pain in the teeth.  She reports significant past medical history of the same, was diagnosed with infected tooth started on antibiotics which improved her symptoms.  Patient denies any IV drug use, nausea or vomiting.   Past Medical History:  Diagnosis Date  . Anxiety   . Asthma   . Headache     There are no active problems to display for this patient.   Past Surgical History:  Procedure Laterality Date  . FRACTURE SURGERY    . ORTHOPEDIC SURGERY      OB History    Gravida Para Term Preterm AB Living   0 0 0 0 0     SAB TAB Ectopic Multiple Live Births   0 0 0           Home Medications    Prior to Admission medications   Medication Sig Start Date End Date Taking? Authorizing Provider  PRESCRIPTION MEDICATION Take 1 tablet by mouth as needed.   Yes Historical Provider, MD  clindamycin (CLEOCIN) 300 MG capsule Take 1 capsule (300 mg total) by mouth 3 (three) times daily. 05/10/16   Eyvonne Mechanic, PA-C  traMADol (ULTRAM) 50 MG tablet Take 1 tablet (50 mg total) by mouth every 6 (six) hours as needed. 05/10/16   Eyvonne Mechanic, PA-C    Family History History reviewed. No pertinent family history.  Social History Social History  Substance Use Topics  . Smoking status: Current Every Day Smoker    Packs/day: 1.00    Years: 3.00    Types: Cigarettes  . Smokeless tobacco:  Never Used  . Alcohol use No     Allergies   Benadryl [diphenhydramine hcl (sleep)]; Vancomycin; Diphenhydramine; and Peanut-containing drug products   Review of Systems Review of Systems  All other systems reviewed and are negative.    Physical Exam Updated Vital Signs BP 97/60 (BP Location: Right Arm)   Pulse 85   Temp 98.8 F (37.1 C) (Oral)   Resp 12   LMP 05/09/2016   SpO2 100%   Physical Exam  Constitutional: She is oriented to person, place, and time. She appears well-developed and well-nourished.  HENT:  Head: Normocephalic and atraumatic.  Right-sided facial swelling around the periocular and cheek.  Small amount of discharge noted.  No ocular discharge, ocular movements are intact with slight discomfort, no rhinorrhea or nasal discharge.  No abnormalities of the oral cavity.  Eyes: Conjunctivae are normal. Pupils are equal, round, and reactive to light. Right eye exhibits no discharge. Left eye exhibits no discharge. No scleral icterus.  Neck: Normal range of motion. No JVD present. No tracheal deviation present.  Pulmonary/Chest: Effort normal. No stridor.  Neurological: She is alert and oriented to person, place, and time. Coordination normal.  Psychiatric: She has a normal mood and affect. Her behavior  is normal. Judgment and thought content normal.  Nursing note and vitals reviewed.    ED Treatments / Results  Labs (all labs ordered are listed, but only abnormal results are displayed) Labs Reviewed  CBC WITH DIFFERENTIAL/PLATELET - Abnormal; Notable for the following:       Result Value   WBC 19.1 (*)    Hemoglobin 9.5 (*)    HCT 31.1 (*)    MCV 77.9 (*)    MCH 23.8 (*)    RDW 18.2 (*)    Neutro Abs 15.1 (*)    Monocytes Absolute 1.8 (*)    All other components within normal limits  BASIC METABOLIC PANEL - Abnormal; Notable for the following:    Calcium 8.6 (*)    All other components within normal limits    EKG  EKG Interpretation None         Radiology Ct Maxillofacial W Contrast  Result Date: 05/10/2016 CLINICAL DATA:  Facial swelling, cellulitis. EXAM: CT MAXILLOFACIAL WITH CONTRAST TECHNIQUE: Multidetector CT imaging of the maxillofacial structures was performed. Multiplanar CT image reconstructions were also generated. A small metallic BB was placed on the right temple in order to reliably differentiate right from left. COMPARISON:  03/01/2016 FINDINGS: Osseous: No fracture or mandibular dislocation. No destructive process. Orbits: Negative. No traumatic or inflammatory finding. Sinuses: Paranasal sinuses clear.  Mastoid air cells clear. Soft tissues: Soft tissue swelling noted in the right side of the face with subcutaneous edema/stranding. No drainable fluid collection. Limited intracranial: Negative IMPRESSION: Soft tissue swelling and stranding in the subcutaneous soft tissues of the right face compatible with cellulitis. No drainable fluid collection. Electronically Signed   By: Charlett NoseKevin  Dover M.D.   On: 05/10/2016 17:36    Procedures Procedures (including critical care time)  INCISION AND DRAINAGE Performed by: Thermon LeylandHedges,Casimira Sutphin Todd Consent: Verbal consent obtained. Risks and benefits: risks, benefits and alternatives were discussed Type: abscess  Body area: right cheek   Anesthesia: local infiltration  Incision was made with a scalpel.  Local anesthetic: lidocaine 2 % 0 epinephrine  Anesthetic total: 4 ml  Complexity: complex Blunt dissection to break up loculations  Drainage: purulent  Drainage amount: 0.5  Packing material: 1/4 in iodoform gauze  Patient tolerance: Patient tolerated the procedure well with no immediate complications.    Medications Ordered in ED Medications  iopamidol (ISOVUE-300) 61 % injection (not administered)  sodium chloride 0.9 % bolus 1,000 mL (0 mLs Intravenous Stopped 05/10/16 1911)  acetaminophen (TYLENOL) tablet 650 mg (650 mg Oral Given 05/10/16 1739)  lidocaine  (XYLOCAINE) 2 % (with pres) injection 400 mg (400 mg Infiltration Given 05/10/16 1738)  clindamycin (CLEOCIN) IVPB 600 mg (0 mg Intravenous Stopped 05/10/16 1911)  iopamidol (ISOVUE-300) 61 % injection 75 mL (75 mLs Intravenous Contrast Given 05/10/16 1714)  oxyCODONE-acetaminophen (PERCOCET/ROXICET) 5-325 MG per tablet 1 tablet (1 tablet Oral Given 05/10/16 1944)     Initial Impression / Assessment and Plan / ED Course  I have reviewed the triage vital signs and the nursing notes.  Pertinent labs & imaging results that were available during my care of the patient were reviewed by me and considered in my medical decision making (see chart for details).      Final Clinical Impressions(s) / ED Diagnoses   Final diagnoses:  Abscess  Preseptal cellulitis   Labs: CBC, BMP  Imaging: CT maxillofacial  Consults:  Therapeutics: Clindamycin  Discharge Meds: Clindamycin  Assessment/Plan:   21 year old female presents with an abscess to her right cheek.  This is likely caused by her popping a pimple.  She has fever and tachycardia here originally.  She was given Tylenol and normal saline.  Patient has preseptal cellulitis, no significant findings for orbital cellulitis.  Patient will be started on antibiotics after given the IV dose here.  She will follow up tomorrow for reassessment and further evaluation.  Patient will be staying with her sister who will return immediately if any new or worsening signs or symptoms present.  Both the patient and her sister verbalized understanding and agreement to today's plan and had no further questions or concerns with home discharge     New Prescriptions New Prescriptions   CLINDAMYCIN (CLEOCIN) 300 MG CAPSULE    Take 1 capsule (300 mg total) by mouth 3 (three) times daily.   TRAMADOL (ULTRAM) 50 MG TABLET    Take 1 tablet (50 mg total) by mouth every 6 (six) hours as needed.     Eyvonne Mechanic, PA-C 05/10/16 1946    Azalia Bilis, MD 05/11/16  [phone]

## 2016-05-10 NOTE — Discharge Instructions (Signed)
Please read attached information. If you experience any new or worsening signs or symptoms please return to the emergency room for evaluation.  Follow-up in the emergency room tomorrow for reassessment and evaluation.  Please take antibiotics as directed.

## 2016-05-10 NOTE — ED Notes (Signed)
Pt refuses for this RN to put IV in her or draw blood.  Pt will wake up slightly and then pull her arms back.  Refuses to let me put gown on. Stated "leave me alone".  Will make MD aware.

## 2016-05-10 NOTE — ED Notes (Signed)
Patient transported to CT 

## 2016-05-10 NOTE — ED Triage Notes (Signed)
Pt started having facial swelling on Monday.  Has happened before with abscess.  Family member gave her a muscle relaxer.  Lethargic on assessment.  Pt has had no facial injury.

## 2016-05-10 NOTE — ED Notes (Signed)
ED Provider at bedside. 

## 2016-05-10 NOTE — ED Notes (Signed)
Pt ambulatory to restroom

## 2017-03-12 NOTE — L&D Delivery Note (Addendum)
Patient is a 22 y.o. now G2P1011 s/p NSVD at 4292w1d, who was admitted for IOL for postdates.  She progressed with augmentation to complete.  Cord clamping delayed by 1 minute then clamped by me with supervision and cut by father of the baby.  Placenta intact and spontaneous expulsion, with increased bleeding noted. Oral cytotec 800mcg and IM methergine given shortly after delivery. Additional fundal massage resulted with good tone. Cervical edges examined without lacerations. Labial and vaginal lacerations repaired without difficulty by Dr. Emelda FearFerguson. She requests IUD for birth control.  Delivery Note At 8:33 PM a viable female was delivered via Vaginal, Spontaneous (Presentation: LOA).  APGAR: 8, 9; weight pending.   Placenta status: intact.  Cord: 3V with the following complications: N/A.  Cord pH: N/A  Anesthesia: Epidural Episiotomy: None Lacerations: Vaginal, labial Suture Repair: 2.0, 3.0 vicryl Est. Blood Loss (mL): 2500  Mom to postpartum.  Baby to Couplet care / Skin to Skin.  Ellwood DenseAlison Rumball, DO 06/07/17, 9:54 PM  OB FELLOW DELIVERY ATTESTATION  I was gloved and present for the delivery in its entirety, and I agree with the above resident's note.    Caryl AdaJazma Phelps, DO OB Fellow 11:02 PM

## 2017-03-20 ENCOUNTER — Encounter: Payer: Self-pay | Admitting: Student

## 2017-03-20 ENCOUNTER — Encounter (HOSPITAL_COMMUNITY): Payer: Self-pay | Admitting: Student

## 2017-03-20 ENCOUNTER — Ambulatory Visit (INDEPENDENT_AMBULATORY_CARE_PROVIDER_SITE_OTHER): Payer: Self-pay | Admitting: Student

## 2017-03-20 VITALS — BP 95/61 | HR 85 | Wt 112.3 lb

## 2017-03-20 DIAGNOSIS — Z3482 Encounter for supervision of other normal pregnancy, second trimester: Secondary | ICD-10-CM

## 2017-03-20 DIAGNOSIS — Z3480 Encounter for supervision of other normal pregnancy, unspecified trimester: Secondary | ICD-10-CM

## 2017-03-20 DIAGNOSIS — Z87898 Personal history of other specified conditions: Secondary | ICD-10-CM

## 2017-03-20 DIAGNOSIS — O219 Vomiting of pregnancy, unspecified: Secondary | ICD-10-CM

## 2017-03-20 DIAGNOSIS — F1911 Other psychoactive substance abuse, in remission: Secondary | ICD-10-CM

## 2017-03-20 DIAGNOSIS — O0990 Supervision of high risk pregnancy, unspecified, unspecified trimester: Secondary | ICD-10-CM | POA: Insufficient documentation

## 2017-03-20 DIAGNOSIS — Z113 Encounter for screening for infections with a predominantly sexual mode of transmission: Secondary | ICD-10-CM

## 2017-03-20 DIAGNOSIS — F1991 Other psychoactive substance use, unspecified, in remission: Secondary | ICD-10-CM

## 2017-03-20 HISTORY — DX: Supervision of high risk pregnancy, unspecified, unspecified trimester: O09.90

## 2017-03-20 LAB — POCT URINALYSIS DIP (DEVICE)
BILIRUBIN URINE: NEGATIVE
Bilirubin Urine: NEGATIVE
Glucose, UA: NEGATIVE mg/dL
Glucose, UA: NEGATIVE mg/dL
HGB URINE DIPSTICK: NEGATIVE
HGB URINE DIPSTICK: NEGATIVE
KETONES UR: NEGATIVE mg/dL
KETONES UR: NEGATIVE mg/dL
Leukocytes, UA: NEGATIVE
Leukocytes, UA: NEGATIVE
Nitrite: NEGATIVE
Nitrite: NEGATIVE
PH: 7 (ref 5.0–8.0)
PH: 7 (ref 5.0–8.0)
PROTEIN: NEGATIVE mg/dL
Protein, ur: NEGATIVE mg/dL
SPECIFIC GRAVITY, URINE: 1.02 (ref 1.005–1.030)
Specific Gravity, Urine: 1.015 (ref 1.005–1.030)
Urobilinogen, UA: 0.2 mg/dL (ref 0.0–1.0)
Urobilinogen, UA: 0.2 mg/dL (ref 0.0–1.0)

## 2017-03-20 MED ORDER — METOCLOPRAMIDE HCL 10 MG PO TABS: 10.0000 mg | ORAL_TABLET | Freq: Three times a day (TID) | ORAL | 0 refills | Status: DC | PRN

## 2017-03-20 NOTE — Patient Instructions (Signed)
How a Baby Grows During Pregnancy Pregnancy begins when a female's sperm enters a female's egg (fertilization). This happens in one of the tubes (fallopian tubes) that connect the ovaries to the womb (uterus). The fertilized egg is called an embryo until it reaches 10 weeks. From 10 weeks until birth, it is called a fetus. The fertilized egg moves down the fallopian tube to the uterus. Then it implants into the lining of the uterus and begins to grow. The developing fetus receives oxygen and nutrients through the pregnant woman's bloodstream and the tissues that grow (placenta) to support the fetus. The placenta is the life support system for the fetus. It provides nutrition and removes waste. Learning as much as you can about your pregnancy and how your baby is developing can help you enjoy the experience. It can also make you aware of when there might be a problem and when to ask questions. How long does a typical pregnancy last? A pregnancy usually lasts 280 days, or about 40 weeks. Pregnancy is divided into three trimesters:  First trimester: 0-13 weeks.  Second trimester: 14-27 weeks.  Third trimester: 28-40 weeks.  The day when your baby is considered ready to be born (full term) is your estimated date of delivery. How does my baby develop month by month? First month  The fertilized egg attaches to the inside of the uterus.  Some cells will form the placenta. Others will form the fetus.  The arms, legs, brain, spinal cord, lungs, and heart begin to develop.  At the end of the first month, the heart begins to beat.  Second month  The bones, inner ear, eyelids, hands, and feet form.  The genitals develop.  By the end of 8 weeks, all major organs are developing.  Third month  All of the internal organs are forming.  Teeth develop below the gums.  Bones and muscles begin to grow. The spine can flex.  The skin is transparent.  Fingernails and toenails begin to form.  Arms  and legs continue to grow longer, and hands and feet develop.  The fetus is about 3 in (7.6 cm) long.  Fourth month  The placenta is completely formed.  The external sex organs, neck, outer ear, eyebrows, eyelids, and fingernails are formed.  The fetus can hear, swallow, and move its arms and legs.  The kidneys begin to produce urine.  The skin is covered with a white waxy coating (vernix) and very fine hair (lanugo).  Fifth month  The fetus moves around more and can be felt for the first time (quickening).  The fetus starts to sleep and wake up and may begin to suck its finger.  The nails grow to the end of the fingers.  The organ in the digestive system that makes bile (gallbladder) functions and helps to digest the nutrients.  If your baby is a girl, eggs are present in her ovaries. If your baby is a boy, testicles start to move down into his scrotum.  Sixth month  The lungs are formed, but the fetus is not yet able to breathe.  The eyes open. The brain continues to develop.  Your baby has fingerprints and toe prints. Your baby's hair grows thicker.  At the end of the second trimester, the fetus is about 9 in (22.9 cm) long.  Seventh month  The fetus kicks and stretches.  The eyes are developed enough to sense changes in light.  The hands can make a grasping motion.  The   fetus responds to sound.  Eighth month  All organs and body systems are fully developed and functioning.  Bones harden and taste buds develop. The fetus may hiccup.  Certain areas of the brain are still developing. The skull remains soft.  Ninth month  The fetus gains about  lb (0.23 kg) each week.  The lungs are fully developed.  Patterns of sleep develop.  The fetus's head typically moves into a head-down position (vertex) in the uterus to prepare for birth. If the buttocks move into a vertex position instead, the baby is breech.  The fetus weighs 6-9 lbs (2.72-4.08 kg) and is  19-20 in (48.26-50.8 cm) long.  What can I do to have a healthy pregnancy and help my baby develop? Eating and Drinking  Eat a healthy diet. ? Talk with your health care provider to make sure that you are getting the nutrients that you and your baby need. ? Visit www.choosemyplate.gov to learn about creating a healthy diet.  Gain a healthy amount of weight during pregnancy as advised by your health care provider. This is usually 25-35 pounds. You may need to: ? Gain more if you were underweight before getting pregnant or if you are pregnant with more than one baby. ? Gain less if you were overweight or obese when you got pregnant.  Medicines and Vitamins  Take prenatal vitamins as directed by your health care provider. These include vitamins such as folic acid, iron, calcium, and vitamin D. They are important for healthy development.  Take medicines only as directed by your health care provider. Read labels and ask a pharmacist or your health care provider whether over-the-counter medicines, supplements, and prescription drugs are safe to take during pregnancy.  Activities  Be physically active as advised by your health care provider. Ask your health care provider to recommend activities that are safe for you to do, such as walking or swimming.  Do not participate in strenuous or extreme sports.  Lifestyle  Do not drink alcohol.  Do not use any tobacco products, including cigarettes, chewing tobacco, or electronic cigarettes. If you need help quitting, ask your health care provider.  Do not use illegal drugs.  Safety  Avoid exposure to mercury, lead, or other heavy metals. Ask your health care provider about common sources of these heavy metals.  Avoid listeria infection during pregnancy. Follow these precautions: ? Do not eat soft cheeses or deli meats. ? Do not eat hot dogs unless they have been warmed up to the point of steaming, such as in the microwave oven. ? Do not  drink unpasteurized milk.  Avoid toxoplasmosis infection during pregnancy. Follow these precautions: ? Do not change your cat's litter box, if you have a cat. Ask someone else to do this for you. ? Wear gardening gloves while working in the yard.  General Instructions  Keep all follow-up visits as directed by your health care provider. This is important. This includes prenatal care and screening tests.  Manage any chronic health conditions. Work closely with your health care provider to keep conditions, such as diabetes, under control.  How do I know if my baby is developing well? At each prenatal visit, your health care provider will do several different tests to check on your health and keep track of your baby's development. These include:  Fundal height. ? Your health care provider will measure your growing belly from top to bottom using a tape measure. ? Your health care provider will also feel your belly   to determine your baby's position.  Heartbeat. ? An ultrasound in the first trimester can confirm pregnancy and show a heartbeat, depending on how far along you are. ? Your health care provider will check your baby's heart rate at every prenatal visit. ? As you get closer to your delivery date, you may have regular fetal heart rate monitoring to make sure that your baby is not in distress.  Second trimester ultrasound. ? This ultrasound checks your baby's development. It also indicates your baby's gender.  What should I do if I have concerns about my baby's development? Always talk with your health care provider about any concerns that you may have. This information is not intended to replace advice given to you by your health care provider. Make sure you discuss any questions you have with your health care provider. Document Released: 08/15/2007 Document Revised: 08/04/2015 Document Reviewed: 08/05/2013 Elsevier Interactive Patient Education  2018 [employer].    Second  Trimester of Pregnancy The second trimester is from week 14 through week 27 (months 4 through 6). The second trimester is often a time when you feel your best. Your body has adjusted to being pregnant, and you begin to feel better physically. Usually, morning sickness has lessened or quit completely, you may have more energy, and you may have an increase in appetite. The second trimester is also a time when the fetus is growing rapidly. At the end of the sixth month, the fetus is about 9 inches long and weighs about 1 pounds. You will likely begin to feel the baby move (quickening) between 16 and 20 weeks of pregnancy. Body changes during your second trimester Your body continues to go through many changes during your second trimester. The changes vary from woman to woman.  Your weight will continue to increase. You will notice your lower abdomen bulging out.  You may begin to get stretch marks on your hips, abdomen, and breasts.  You may develop headaches that can be relieved by medicines. The medicines should be approved by your health care provider.  You may urinate more often because the fetus is pressing on your bladder.  You may develop or continue to have heartburn as a result of your pregnancy.  You may develop constipation because certain hormones are causing the muscles that push waste through your intestines to slow down.  You may develop hemorrhoids or swollen, bulging veins (varicose veins).  You may have back pain. This is caused by: ? Weight gain. ? Pregnancy hormones that are relaxing the joints in your pelvis. ? A shift in weight and the muscles that support your balance.  Your breasts will continue to grow and they will continue to become tender.  Your gums may bleed and may be sensitive to brushing and flossing.  Dark spots or blotches (chloasma, mask of pregnancy) may develop on your face. This will likely fade after the baby is born.  A dark line from your belly  button to the pubic area (linea nigra) may appear. This will likely fade after the baby is born.  You may have changes in your hair. These can include thickening of your hair, rapid growth, and changes in texture. Some women also have hair loss during or after pregnancy, or hair that feels dry or thin. Your hair will most likely return to normal after your baby is born.  What to expect at prenatal visits During a routine prenatal visit:  You will be weighed to make sure you and the  fetus are growing normally.  Your blood pressure will be taken.  Your abdomen will be measured to track your baby's growth.  The fetal heartbeat will be listened to.  Any test results from the previous visit will be discussed.  Your health care provider may ask you:  How you are feeling.  If you are feeling the baby move.  If you have had any abnormal symptoms, such as leaking fluid, bleeding, severe headaches, or abdominal cramping.  If you are using any tobacco products, including cigarettes, chewing tobacco, and electronic cigarettes.  If you have any questions.  Other tests that may be performed during your second trimester include:  Blood tests that check for: ? Low iron levels (anemia). ? High blood sugar that affects pregnant women (gestational diabetes) between 36 and 28 weeks. ? Rh antibodies. This is to check for a protein on red blood cells (Rh factor).  Urine tests to check for infections, diabetes, or protein in the urine.  An ultrasound to confirm the proper growth and development of the baby.  An amniocentesis to check for possible genetic problems.  Fetal screens for spina bifida and Down syndrome.  HIV (human immunodeficiency virus) testing. Routine prenatal testing includes screening for HIV, unless you choose not to have this test.  Follow these instructions at home: Medicines  Follow your health care provider's instructions regarding medicine use. Specific medicines may  be either safe or unsafe to take during pregnancy.  Take a prenatal vitamin that contains at least 600 micrograms (mcg) of folic acid.  If you develop constipation, try taking a stool softener if your health care provider approves. Eating and drinking  Eat a balanced diet that includes fresh fruits and vegetables, whole grains, good sources of protein such as meat, eggs, or tofu, and low-fat dairy. Your health care provider will help you determine the amount of weight gain that is right for you.  Avoid raw meat and uncooked cheese. These carry germs that can cause birth defects in the baby.  If you have low calcium intake from food, talk to your health care provider about whether you should take a daily calcium supplement.  Limit foods that are high in fat and processed sugars, such as fried and sweet foods.  To prevent constipation: ? Drink enough fluid to keep your urine clear or pale yellow. ? Eat foods that are high in fiber, such as fresh fruits and vegetables, whole grains, and beans. Activity  Exercise only as directed by your health care provider. Most women can continue their usual exercise routine during pregnancy. Try to exercise for 30 minutes at least 5 days a week. Stop exercising if you experience uterine contractions.  Avoid heavy lifting, wear low heel shoes, and practice good posture.  A sexual relationship may be continued unless your health care provider directs you otherwise. Relieving pain and discomfort  Wear a good support bra to prevent discomfort from breast tenderness.  Take warm sitz baths to soothe any pain or discomfort caused by hemorrhoids. Use hemorrhoid cream if your health care provider approves.  Rest with your legs elevated if you have leg cramps or low back pain.  If you develop varicose veins, wear support hose. Elevate your feet for 15 minutes, 3-4 times a day. Limit salt in your diet. Prenatal Care  Write down your questions. Take them to  your prenatal visits.  Keep all your prenatal visits as told by your health care provider. This is important. Safety  Wear your  seat belt at all times when driving.  Make a list of emergency phone numbers, including numbers for family, friends, the hospital, and police and fire departments. General instructions  Ask your health care provider for a referral to a local prenatal education class. Begin classes no later than the beginning of month 6 of your pregnancy.  Ask for help if you have counseling or nutritional needs during pregnancy. Your health care provider can offer advice or refer you to specialists for help with various needs.  Do not use hot tubs, steam rooms, or saunas.  Do not douche or use tampons or scented sanitary pads.  Do not cross your legs for long periods of time.  Avoid cat litter boxes and soil used by cats. These carry germs that can cause birth defects in the baby and possibly loss of the fetus by miscarriage or stillbirth.  Avoid all smoking, herbs, alcohol, and unprescribed drugs. Chemicals in these products can affect the formation and growth of the baby.  Do not use any products that contain nicotine or tobacco, such as cigarettes and e-cigarettes. If you need help quitting, ask your health care provider.  Visit your dentist if you have not gone yet during your pregnancy. Use a soft toothbrush to brush your teeth and be gentle when you floss. Contact a health care provider if:  You have dizziness.  You have mild pelvic cramps, pelvic pressure, or nagging pain in the abdominal area.  You have persistent nausea, vomiting, or diarrhea.  You have a bad smelling vaginal discharge.  You have pain when you urinate. Get help right away if:  You have a fever.  You are leaking fluid from your vagina.  You have spotting or bleeding from your vagina.  You have severe abdominal cramping or pain.  You have rapid weight gain or weight loss.  You have  shortness of breath with chest pain.  You notice sudden or extreme swelling of your face, hands, ankles, feet, or legs.  You have not felt your baby move in over an hour.  You have severe headaches that do not go away when you take medicine.  You have vision changes. Summary  The second trimester is from week 14 through week 27 (months 4 through 6). It is also a time when the fetus is growing rapidly.  Your body goes through many changes during pregnancy. The changes vary from woman to woman.  Avoid all smoking, herbs, alcohol, and unprescribed drugs. These chemicals affect the formation and growth your baby.  Do not use any tobacco products, such as cigarettes, chewing tobacco, and e-cigarettes. If you need help quitting, ask your health care provider.  Contact your health care provider if you have any questions. Keep all prenatal visits as told by your health care provider. This is important. This information is not intended to replace advice given to you by your health care provider. Make sure you discuss any questions you have with your health care provider. Document Released: 02/20/2001 Document Revised: 04/03/2016 Document Reviewed: 04/03/2016 Elsevier Interactive Patient Education  [employer]2018 Elsevier Inc.

## 2017-03-21 LAB — OBSTETRIC PANEL, INCLUDING HIV
ANTIBODY SCREEN: NEGATIVE
BASOS: 0 %
Basophils Absolute: 0 10*3/uL (ref 0.0–0.2)
EOS (ABSOLUTE): 0.3 10*3/uL (ref 0.0–0.4)
Eos: 2 %
HEMATOCRIT: 35.1 % (ref 34.0–46.6)
HIV Screen 4th Generation wRfx: NONREACTIVE
Hemoglobin: 11.6 g/dL (ref 11.1–15.9)
Hepatitis B Surface Ag: NEGATIVE
IMMATURE GRANS (ABS): 0.2 10*3/uL — AB (ref 0.0–0.1)
Immature Granulocytes: 1 %
LYMPHS ABS: 2.3 10*3/uL (ref 0.7–3.1)
LYMPHS: 16 %
MCH: 28.6 pg (ref 26.6–33.0)
MCHC: 33 g/dL (ref 31.5–35.7)
MCV: 87 fL (ref 79–97)
MONOS ABS: 1.2 10*3/uL — AB (ref 0.1–0.9)
Monocytes: 9 %
NEUTROS ABS: 10.2 10*3/uL — AB (ref 1.4–7.0)
Neutrophils: 72 %
Platelets: 223 10*3/uL (ref 150–379)
RBC: 4.05 x10E6/uL (ref 3.77–5.28)
RDW: 17.1 % — ABNORMAL HIGH (ref 12.3–15.4)
RH TYPE: POSITIVE
RPR Ser Ql: NONREACTIVE
Rubella Antibodies, IGG: 4.89 index (ref >0.99)
WBC: 14.1 10*3/uL — ABNORMAL HIGH (ref 3.4–10.8)

## 2017-03-21 LAB — DRUG SCREEN, URINE
Amphetamines, Urine: NEGATIVE ng/mL
BARBITURATE SCREEN URINE: NEGATIVE ng/mL
Benzodiazepine Quant, Ur: NEGATIVE ng/mL
COCAINE (METAB.): NEGATIVE ng/mL
Cannabinoid Quant, Ur: NEGATIVE ng/mL
OPIATE QUANT UR: NEGATIVE ng/mL
PCP Quant, Ur: NEGATIVE ng/mL

## 2017-03-21 LAB — GC/CHLAMYDIA PROBE AMP (~~LOC~~) NOT AT ARMC
Chlamydia: NEGATIVE
Neisseria Gonorrhea: NEGATIVE

## 2017-03-22 LAB — CULTURE, OB URINE

## 2017-03-22 LAB — URINE CULTURE, OB REFLEX

## 2017-03-23 DIAGNOSIS — Z87898 Personal history of other specified conditions: Secondary | ICD-10-CM | POA: Insufficient documentation

## 2017-03-23 DIAGNOSIS — F1991 Other psychoactive substance use, unspecified, in remission: Secondary | ICD-10-CM | POA: Insufficient documentation

## 2017-03-23 NOTE — Progress Notes (Signed)
  Subjective:    [name] is a G2P0010 545w0d being seen today for her first obstetrical visit.  Her obstetrical history is significant for smoker. Pregnancy history fully reviewed.  Patient reports no complaints.  Vitals:   03/20/17 1111  BP: 95/61  Pulse: 85  Weight: 112 lb 4.8 oz (50.9 kg)    HISTORY: OB History  Gravida Para Term Preterm AB Living  2 0 0 0 1    SAB TAB Ectopic Multiple Live Births  1 0 0        # Outcome Date GA Lbr Len/2nd Weight Sex Delivery Anes PTL Lv  2 Current           1 SAB 2015             Past Medical History:  Diagnosis Date  . Anxiety   . Asthma   . Headache    Past Surgical History:  Procedure Laterality Date  . FRACTURE SURGERY    . ORTHOPEDIC SURGERY     Family History  Problem Relation Age of Onset  . COPD Mother   . Heart disease Father      Exam     Skin: normal coloration and turgor, no rashes    Neurologic: oriented, normal, normal mood   Neck supple and no masses   Cardiovascular: regular rate and rhythm, no murmurs or gallops   Respiratory:  appears well, vitals normal, no respiratory distress, acyanotic, normal RR, chest clear, no wheezing, crepitations, rhonchi, normal symmetric air entry   Abdomen: soft, non-tender; bowel sounds normal; no masses,  no organomegaly. Fundal height 26 cm.       Assessment:    Pregnancy: G2P0010 Patient Active Problem List   Diagnosis Date Noted  . Imprisonment and other incarceration 03/23/2017  . History of drug use 03/23/2017  . Supervision of other normal pregnancy, antepartum 03/20/2017        Plan:   1. Supervision of other normal pregnancy, antepartum  - US MFM OB COMP + 14 WK; Future - Obstetric Panel, Including HIV - Culture, OB Urine - GC[facility]Chlamydia [facility] - Drug Screen, Urine  2. Nausea and vomiting during pregnancy  - metoCLOPramide (REGLAN) 10 MG tablet; Take 1 tablet (10 mg total) by mouth every 8 (eight) hours as  needed for nausea.  Dispense: 30 tablet; Refill: 0  3. Hx of drug abuse -pt signed consent for UDS - Drug Screen, Urine  4. Imprisonment and other incarceration -due for release end of February   Return to care in 4 weeks. Discussed preterm labor precautions.    Judeth Hornrin Achille Xiang 03/23/2017

## 2017-03-26 ENCOUNTER — Encounter: Payer: Self-pay | Admitting: *Deleted

## 2017-03-26 ENCOUNTER — Ambulatory Visit (HOSPITAL_COMMUNITY)
Admission: RE | Admit: 2017-03-26 | Discharge: 2017-03-26 | Disposition: A | Discharge status: Home / Self Care | Payer: Self-pay | Source: Ambulatory Visit | Attending: Student | Admitting: Student

## 2017-03-26 ENCOUNTER — Other Ambulatory Visit: Payer: Self-pay | Admitting: Student

## 2017-03-26 DIAGNOSIS — Z3A27 27 weeks gestation of pregnancy: Secondary | ICD-10-CM

## 2017-03-26 DIAGNOSIS — Z3480 Encounter for supervision of other normal pregnancy, unspecified trimester: Secondary | ICD-10-CM

## 2017-03-26 DIAGNOSIS — Z363 Encounter for antenatal screening for malformations: Secondary | ICD-10-CM | POA: Insufficient documentation

## 2017-03-26 DIAGNOSIS — Z3483 Encounter for supervision of other normal pregnancy, third trimester: Secondary | ICD-10-CM | POA: Insufficient documentation

## 2017-03-27 ENCOUNTER — Encounter: Payer: Self-pay | Admitting: Student

## 2017-03-27 ENCOUNTER — Encounter: Payer: Self-pay | Admitting: Family Medicine

## 2017-03-27 DIAGNOSIS — O0933 Supervision of pregnancy with insufficient antenatal care, third trimester: Secondary | ICD-10-CM | POA: Insufficient documentation

## 2017-03-27 HISTORY — DX: Supervision of pregnancy with insufficient antenatal care, third trimester: O09.33

## 2017-04-03 ENCOUNTER — Encounter: Payer: Self-pay | Admitting: Medical

## 2017-04-03 ENCOUNTER — Ambulatory Visit (INDEPENDENT_AMBULATORY_CARE_PROVIDER_SITE_OTHER): Payer: Self-pay | Admitting: Medical

## 2017-04-03 VITALS — BP 108/57 | HR 84 | Wt 118.0 lb

## 2017-04-03 DIAGNOSIS — Z23 Encounter for immunization: Secondary | ICD-10-CM

## 2017-04-03 DIAGNOSIS — O219 Vomiting of pregnancy, unspecified: Secondary | ICD-10-CM

## 2017-04-03 DIAGNOSIS — O0993 Supervision of high risk pregnancy, unspecified, third trimester: Secondary | ICD-10-CM

## 2017-04-03 DIAGNOSIS — IMO0002 Reserved for concepts with insufficient information to code with codable children: Secondary | ICD-10-CM

## 2017-04-03 DIAGNOSIS — Z0489 Encounter for examination and observation for other specified reasons: Secondary | ICD-10-CM

## 2017-04-03 DIAGNOSIS — O0990 Supervision of high risk pregnancy, unspecified, unspecified trimester: Secondary | ICD-10-CM

## 2017-04-03 MED ORDER — ONDANSETRON HCL 4 MG PO TABS: 4.0000 mg | ORAL_TABLET | Freq: Three times a day (TID) | ORAL | 0 refills | Status: DC | PRN

## 2017-04-03 NOTE — Progress Notes (Signed)
   PRENATAL VISIT NOTE  Subjective:  [name] is a 22 y.o. G2P0010 at 7434w6d being seen today for ongoing prenatal care.  She is currently monitored for the following issues for this low-risk pregnancy and has Supervision of high risk pregnancy, antepartum; Imprisonment and other incarceration; History of drug use; and Limited prenatal care in third trimester on their problem list.  Patient reports nausea.  Contractions: Not present. Vag. Bleeding: None.  Movement: Present. Denies leaking of fluid.   The following portions of the patient's history were reviewed and updated as appropriate: allergies, current medications, past family history, past medical history, past social history, past surgical history and problem list. Problem list updated.  Objective:   Vitals:   04/03/17 1329  BP: (!) 108/57  Pulse: 84  Weight: 118 lb (53.5 kg)    Fetal Status: Fetal Heart Rate (bpm): 140 Fundal Height: 31 cm Movement: Present     General:  Alert, oriented and cooperative. Patient is in no acute distress.  Skin: Skin is warm and dry. No rash noted.   Cardiovascular: Normal heart rate noted  Respiratory: Normal respiratory effort, no problems with respiration noted  Abdomen: Soft, gravid, appropriate for gestational age.  Pain/Pressure: Absent     Pelvic: Cervical exam deferred        Extremities: Normal range of motion.  Edema: None  Mental Status:  Normal mood and affect. Normal behavior. Normal judgment and thought content.   Assessment and Plan:  Pregnancy: G2P0010 at 7934w6d  1. Supervision of high risk pregnancy, antepartum, third trimester - Glucose Tolerance, 1 Hour - Declined flu vaccine - Requested nausea medication. Rx for Zofran given.   2. Need for diphtheria-tetanus-pertussis (Tdap) vaccine - Tdap vaccine greater than or equal to 7yo IM  3. Evaluate anatomy not seen on prior sonogram - US MFM OB FOLLOW UP; scheduled  5. Imprisonment and other incarceration -  Paperwork completed and returned to chaperone  Preterm labor symptoms and general obstetric precautions including but not limited to vaginal bleeding, contractions, leaking of fluid and fetal movement were reviewed in detail with the patient. Please refer to After Visit Summary for other counseling recommendations.  Return in about 2 weeks (around 04/17/2017) for LOB.   Vonzella NippleJulie Darrell Hauk, PA-C

## 2017-04-03 NOTE — Patient Instructions (Signed)

## 2017-04-04 LAB — GLUCOSE TOLERANCE, 1 HOUR: Glucose, 1Hr PP: 129 mg/dL (ref 65–199)

## 2017-04-09 ENCOUNTER — Encounter: Payer: Self-pay | Admitting: *Deleted

## 2017-04-23 ENCOUNTER — Ambulatory Visit (HOSPITAL_COMMUNITY)
Admission: RE | Admit: 2017-04-23 | Discharge: 2017-04-23 | Disposition: A | Discharge status: Home / Self Care | Source: Ambulatory Visit | Attending: Medical | Admitting: Medical

## 2017-04-23 ENCOUNTER — Other Ambulatory Visit: Payer: Self-pay | Admitting: Medical

## 2017-04-23 ENCOUNTER — Ambulatory Visit (INDEPENDENT_AMBULATORY_CARE_PROVIDER_SITE_OTHER): Payer: Self-pay | Admitting: Family Medicine

## 2017-04-23 VITALS — BP 118/67 | HR 72 | Wt 122.0 lb

## 2017-04-23 DIAGNOSIS — Z3A34 34 weeks gestation of pregnancy: Secondary | ICD-10-CM

## 2017-04-23 DIAGNOSIS — IMO0002 Reserved for concepts with insufficient information to code with codable children: Secondary | ICD-10-CM

## 2017-04-23 DIAGNOSIS — Z0489 Encounter for examination and observation for other specified reasons: Secondary | ICD-10-CM

## 2017-04-23 DIAGNOSIS — O0933 Supervision of pregnancy with insufficient antenatal care, third trimester: Secondary | ICD-10-CM

## 2017-04-23 DIAGNOSIS — Z362 Encounter for other antenatal screening follow-up: Secondary | ICD-10-CM | POA: Insufficient documentation

## 2017-04-23 DIAGNOSIS — O0990 Supervision of high risk pregnancy, unspecified, unspecified trimester: Secondary | ICD-10-CM

## 2017-04-23 LAB — POCT URINALYSIS DIP (DEVICE)
Bilirubin Urine: NEGATIVE
GLUCOSE, UA: NEGATIVE mg/dL
HGB URINE DIPSTICK: NEGATIVE
Ketones, ur: NEGATIVE mg/dL
NITRITE: NEGATIVE
PROTEIN: NEGATIVE mg/dL
SPECIFIC GRAVITY, URINE: 1.02 (ref 1.005–1.030)
UROBILINOGEN UA: 0.2 mg/dL (ref 0.0–1.0)
pH: 7 (ref 5.0–8.0)

## 2017-04-23 NOTE — Patient Instructions (Signed)

## 2017-04-23 NOTE — Progress Notes (Signed)
   PRENATAL VISIT NOTE  Subjective:  [name] is a 22 y.o. G2P0010 at 4272w5d being seen today for ongoing prenatal care.  She is currently monitored for the following issues for this high-risk pregnancy and has Supervision of high risk pregnancy, antepartum; Imprisonment and other incarceration; History of drug use; and Limited prenatal care in third trimester on their problem list.  Patient reports no complaints.  Contractions: Not present. Vag. Bleeding: None.  Movement: Present. Denies leaking of fluid.   She is excited about being released at the end of this week.   The following portions of the patient's history were reviewed and updated as appropriate: allergies, current medications, past family history, past medical history, past social history, past surgical history and problem list. Problem list updated.  Objective:   Vitals:   04/23/17 0937  BP: 118/67  Pulse: 72  Weight: 122 lb (55.3 kg)    Fetal Status: Fetal Heart Rate (bpm): 118 Fundal Height: 34 cm Movement: Present     General:  Alert, oriented and cooperative. Patient is in no acute distress.  Skin: Skin is warm and dry. No rash noted.   Cardiovascular: Normal heart rate noted  Respiratory: Normal respiratory effort, no problems with respiration noted  Abdomen: Soft, gravid, appropriate for gestational age.  Pain/Pressure: Present     Pelvic: Cervical exam deferred        Extremities: Normal range of motion.  Edema: None  Mental Status:  Normal mood and affect. Normal behavior. Normal judgment and thought content.   Assessment and Plan:  Pregnancy: G2P0010 at 8372w5d Estimated Date of Delivery: 05/30/17   1. Supervision of high risk pregnancy, antepartum Doing well GBS culture next vist  2. Limited prenatal care in third trimester  3. Imprisonment and other incarceration Will be released at the end of this week  Preterm labor symptoms and general obstetric precautions including but not limited to  vaginal bleeding, contractions, leaking of fluid and fetal movement were reviewed in detail with the patient. Please refer to After Visit Summary for other counseling recommendations.  [facility] about 1 [facility] (around 04/30/2017[facility]   Frederik PearJulie P Lilyanah Celestin, MD   Future Appointments  Date Time Provider Department Center  04/30/2017  2:15 PM Pincus LargePhelps, Jazma Y, DO [facility]

## 2017-04-30 ENCOUNTER — Encounter: Payer: Self-pay | Admitting: Obstetrics and Gynecology

## 2017-05-01 ENCOUNTER — Encounter: Payer: Self-pay | Admitting: Obstetrics and Gynecology

## 2017-05-01 ENCOUNTER — Other Ambulatory Visit (HOSPITAL_COMMUNITY)
Admission: RE | Admit: 2017-05-01 | Discharge: 2017-05-01 | Disposition: A | Discharge status: Home / Self Care | Payer: Medicaid Other | Source: Ambulatory Visit | Attending: Obstetrics and Gynecology | Admitting: Obstetrics and Gynecology

## 2017-05-01 ENCOUNTER — Ambulatory Visit (INDEPENDENT_AMBULATORY_CARE_PROVIDER_SITE_OTHER): Payer: Self-pay | Admitting: Obstetrics and Gynecology

## 2017-05-01 VITALS — BP 110/72 | HR 97 | Wt 123.0 lb

## 2017-05-01 DIAGNOSIS — O0993 Supervision of high risk pregnancy, unspecified, third trimester: Secondary | ICD-10-CM | POA: Insufficient documentation

## 2017-05-01 DIAGNOSIS — Z3A35 35 weeks gestation of pregnancy: Secondary | ICD-10-CM | POA: Diagnosis not present

## 2017-05-01 DIAGNOSIS — O0990 Supervision of high risk pregnancy, unspecified, unspecified trimester: Secondary | ICD-10-CM

## 2017-05-01 LAB — OB RESULTS CONSOLE GBS: STREP GROUP B AG: NEGATIVE

## 2017-05-01 NOTE — Progress Notes (Signed)
Subjective:  Victoria SidlesHeather N Ochoa is a 22 y.o. G2P0010 at [redacted]w[redacted]d being seen today for ongoing prenatal care.  She is currently monitored for the following issues for this low-risk pregnancy and has Supervision of high risk pregnancy, antepartum; Imprisonment and other incarceration; History of drug use; and Limited prenatal care in third trimester on their problem list.  Patient reports no complaints.  Contractions: Irritability. Vag. Bleeding: None.  Movement: Present. Denies leaking of fluid.   The following portions of the patient's history were reviewed and updated as appropriate: allergies, current medications, past family history, past medical history, past social history, past surgical history and problem list. Problem list updated.  Objective:   Vitals:   05/01/17 1450  BP: 110/72  Pulse: 97  Weight: 55.8 kg (123 lb)    Fetal Status: Fetal Heart Rate (bpm): 138 Fundal Height: 35 cm Movement: Present     General:  Alert, oriented and cooperative. Patient is in no acute distress.  Skin: Skin is warm and dry. No rash noted.   Cardiovascular: Normal heart rate noted  Respiratory: Normal respiratory effort, no problems with respiration noted  Abdomen: Soft, gravid, appropriate for gestational age. Pain/Pressure: Present     Pelvic: Vag. Bleeding: None     Cervical exam deferred        Extremities: Normal range of motion.     Mental Status: Normal mood and affect. Normal behavior. Normal judgment and thought content.   Urinalysis:      Assessment and Plan:  Pregnancy: G2P0010 at [redacted]w[redacted]d  1. Supervision of high risk pregnancy, antepartum GBS and GC/Chl collected today. Doing well. Continue routine care  2. Imprisonment and other incarceration Recently released from incarceration on 04/27/17.   Preterm labor symptoms and general obstetric precautions including but not limited to vaginal bleeding, contractions, leaking of fluid and fetal movement were reviewed in detail with the  patient. Please refer to After Visit Summary for other counseling recommendations.  Return in about 1 week (around 05/08/2017) for ob visit.   Pincus LargePhelps, Jazma Y, DO

## 2017-05-01 NOTE — Progress Notes (Signed)
Pt stated having restless legs

## 2017-05-01 NOTE — Patient Instructions (Signed)
Braxton Hicks Contractions °Contractions of the uterus can occur throughout pregnancy, but they are not always a sign that you are in labor. You may have practice contractions called Braxton Hicks contractions. These false labor contractions are sometimes confused with true labor. °What are Braxton Hicks contractions? °Braxton Hicks contractions are tightening movements that occur in the muscles of the uterus before labor. Unlike true labor contractions, these contractions do not result in opening (dilation) and thinning of the cervix. Toward the end of pregnancy (32-34 weeks), Braxton Hicks contractions can happen more often and may become stronger. These contractions are sometimes difficult to tell apart from true labor because they can be very uncomfortable. You should not feel embarrassed if you go to the hospital with false labor. °Sometimes, the only way to tell if you are in true labor is for your health care provider to look for changes in the cervix. The health care provider will do a physical exam and may monitor your contractions. If you are not in true labor, the exam should show that your cervix is not dilating and your water has not broken. °If there are other health problems associated with your pregnancy, it is completely safe for you to be sent home with false labor. You may continue to have Braxton Hicks contractions until you go into true labor. °How to tell the difference between true labor and false labor °True labor °· Contractions last 30-70 seconds. °· Contractions become very regular. °· Discomfort is usually felt in the top of the uterus, and it spreads to the lower abdomen and low back. °· Contractions do not go away with walking. °· Contractions usually become more intense and increase in frequency. °· The cervix dilates and gets thinner. °False labor °· Contractions are usually shorter and not as strong as true labor contractions. °· Contractions are usually irregular. °· Contractions  are often felt in the front of the lower abdomen and in the groin. °· Contractions may go away when you walk around or change positions while lying down. °· Contractions get weaker and are shorter-lasting as time goes on. °· The cervix usually does not dilate or become thin. °Follow these instructions at home: °· Take over-the-counter and prescription medicines only as told by your health care provider. °· Keep up with your usual exercises and follow other instructions from your health care provider. °· Eat and drink lightly if you think you are going into labor. °· If Braxton Hicks contractions are making you uncomfortable: °? Change your position from lying down or resting to walking, or change from walking to resting. °? Sit and rest in a tub of warm water. °? Drink enough fluid to keep your urine pale yellow. Dehydration may cause these contractions. °? Do slow and deep breathing several times an hour. °· Keep all follow-up prenatal visits as told by your health care provider. This is important. °Contact a health care provider if: °· You have a fever. °· You have continuous pain in your abdomen. °Get help right away if: °· Your contractions become stronger, more regular, and closer together. °· You have fluid leaking or gushing from your vagina. °· You pass blood-tinged mucus (bloody show). °· You have bleeding from your vagina. °· You have low back pain that you never had before. °· You feel your baby’s head pushing down and causing pelvic pressure. °· Your baby is not moving inside you as much as it used to. °Summary °· Contractions that occur before labor are called Braxton   Hicks contractions, false labor, or practice contractions. °· Braxton Hicks contractions are usually shorter, weaker, farther apart, and less regular than true labor contractions. True labor contractions usually become progressively stronger and regular and they become more frequent. °· Manage discomfort from Braxton Hicks contractions by  changing position, resting in a warm bath, drinking plenty of water, or practicing deep breathing. °This information is not intended to replace advice given to you by your health care provider. Make sure you discuss any questions you have with your health care provider. °Document Released: 07/12/2016 Document Revised: 07/12/2016 Document Reviewed: 07/12/2016 °Elsevier Interactive Patient Education © 2018 [employer] ° °

## 2017-05-02 LAB — OB RESULTS CONSOLE GC/CHLAMYDIA
Chlamydia: NEGATIVE
GC PROBE AMP, GENITAL: NEGATIVE

## 2017-05-02 LAB — GC/CHLAMYDIA PROBE AMP (~~LOC~~) NOT AT ARMC
CHLAMYDIA, DNA PROBE: NEGATIVE
Neisseria Gonorrhea: NEGATIVE

## 2017-05-02 LAB — OB RESULTS CONSOLE GBS: GBS: NEGATIVE

## 2017-05-03 LAB — STREP GP B NAA: Strep Gp B NAA: NEGATIVE

## 2017-05-09 ENCOUNTER — Ambulatory Visit (INDEPENDENT_AMBULATORY_CARE_PROVIDER_SITE_OTHER): Admitting: Obstetrics and Gynecology

## 2017-05-09 ENCOUNTER — Encounter: Payer: Self-pay | Admitting: Obstetrics and Gynecology

## 2017-05-09 VITALS — BP 108/69 | HR 83 | Wt 132.0 lb

## 2017-05-09 DIAGNOSIS — O0990 Supervision of high risk pregnancy, unspecified, unspecified trimester: Secondary | ICD-10-CM

## 2017-05-09 DIAGNOSIS — O0993 Supervision of high risk pregnancy, unspecified, third trimester: Secondary | ICD-10-CM

## 2017-05-09 NOTE — Progress Notes (Signed)
Subjective:  [name] is a 22 y.o. G2P0010 at 6836w0d being seen today for ongoing prenatal care.  She is currently monitored for the following issues for this high-risk pregnancy and has Supervision of high risk pregnancy, antepartum; Imprisonment and other incarceration; History of drug use; and Limited prenatal care in third trimester on their problem list.  Patient reports general discomforts of pregnancy.  Contractions: Irritability. Vag. Bleeding: None.  Movement: Present. Denies leaking of fluid.   The following portions of the patient's history were reviewed and updated as appropriate: allergies, current medications, past family history, past medical history, past social history, past surgical history and problem list. Problem list updated.  Objective:   Vitals:   05/09/17 0936  BP: 108/69  Pulse: 83  Weight: 132 lb (59.9 kg)    Fetal Status: Fetal Heart Rate (bpm): 142   Movement: Present     General:  Alert, oriented and cooperative. Patient is in no acute distress.  Skin: Skin is warm and dry. No rash noted.   Cardiovascular: Normal heart rate noted  Respiratory: Normal respiratory effort, no problems with respiration noted  Abdomen: Soft, gravid, appropriate for gestational age. Pain/Pressure: Present     Pelvic:  Cervical exam deferred        Extremities: Normal range of motion.  Edema: None  Mental Status: Normal mood and affect. Normal behavior. Normal judgment and thought content.   Urinalysis:      Assessment and Plan:  Pregnancy: G2P0010 at 3636w0d  1. Supervision of high risk pregnancy, antepartum stable  2. Imprisonment and other incarceration   Term labor symptoms and general obstetric precautions including but not limited to vaginal bleeding, contractions, leaking of fluid and fetal movement were reviewed in detail with the patient. Please refer to After Visit Summary for other counseling recommendations.  Return in about 1 week (around 05/16/2017) for  OB visit.   Hermina StaggersErvin, Mordche Hedglin L, MD

## 2017-05-09 NOTE — Patient Instructions (Signed)
Third Trimester of Pregnancy The third trimester is from week 28 through week 40 (months 7 through 9). The third trimester is a time when the unborn baby (fetus) is growing rapidly. At the end of the ninth month, the fetus is about 20 inches in length and weighs 6-10 pounds. Body changes during your third trimester Your body will continue to go through many changes during pregnancy. The changes vary from woman to woman. During the third trimester:  Your weight will continue to increase. You can expect to gain 25-35 pounds (11-16 kg) by the end of the pregnancy.  You may begin to get stretch marks on your hips, abdomen, and breasts.  You may urinate more often because the fetus is moving lower into your pelvis and pressing on your bladder.  You may develop or continue to have heartburn. This is caused by increased hormones that slow down muscles in the digestive tract.  You may develop or continue to have constipation because increased hormones slow digestion and cause the muscles that push waste through your intestines to relax.  You may develop hemorrhoids. These are swollen veins (varicose veins) in the rectum that can itch or be painful.  You may develop swollen, bulging veins (varicose veins) in your legs.  You may have increased body aches in the pelvis, back, or thighs. This is due to weight gain and increased hormones that are relaxing your joints.  You may have changes in your hair. These can include thickening of your hair, rapid growth, and changes in texture. Some women also have hair loss during or after pregnancy, or hair that feels dry or thin. Your hair will most likely return to normal after your baby is born.  Your breasts will continue to grow and they will continue to become tender. A yellow fluid (colostrum) may leak from your breasts. This is the first milk you are producing for your baby.  Your belly button may stick out.  You may notice more swelling in your hands,  face, or ankles.  You may have increased tingling or numbness in your hands, arms, and legs. The skin on your belly may also feel numb.  You may feel short of breath because of your expanding uterus.  You may have more problems sleeping. This can be caused by the size of your belly, increased need to urinate, and an increase in your body's metabolism.  You may notice the fetus "dropping," or moving lower in your abdomen (lightening).  You may have increased vaginal discharge.  You may notice your joints feel loose and you may have pain around your pelvic bone.  What to expect at prenatal visits You will have prenatal exams every 2 weeks until week 36. Then you will have weekly prenatal exams. During a routine prenatal visit:  You will be weighed to make sure you and the baby are growing normally.  Your blood pressure will be taken.  Your abdomen will be measured to track your baby's growth.  The fetal heartbeat will be listened to.  Any test results from the previous visit will be discussed.  You may have a cervical check near your due date to see if your cervix has softened or thinned (effaced).  You will be tested for Group B streptococcus. This happens between 35 and 37 weeks.  Your health care provider may ask you:  What your birth plan is.  How you are feeling.  If you are feeling the baby move.  If you have had   any abnormal symptoms, such as leaking fluid, bleeding, severe headaches, or abdominal cramping.  If you are using any tobacco products, including cigarettes, chewing tobacco, and electronic cigarettes.  If you have any questions.  Other tests or screenings that may be performed during your third trimester include:  Blood tests that check for low iron levels (anemia).  Fetal testing to check the health, activity level, and growth of the fetus. Testing is done if you have certain medical conditions or if there are problems during the  pregnancy.  Nonstress test (NST). This test checks the health of your baby to make sure there are no signs of problems, such as the baby not getting enough oxygen. During this test, a belt is placed around your belly. The baby is made to move, and its heart rate is monitored during movement.  What is false labor? False labor is a condition in which you feel small, irregular tightenings of the muscles in the womb (contractions) that usually go away with rest, changing position, or drinking water. These are called Braxton Hicks contractions. Contractions may last for hours, days, or even weeks before true labor sets in. If contractions come at regular intervals, become more frequent, increase in intensity, or become painful, you should see your health care provider. What are the signs of labor?  Abdominal cramps.  Regular contractions that start at 10 minutes apart and become stronger and more frequent with time.  Contractions that start on the top of the uterus and spread down to the lower abdomen and back.  Increased pelvic pressure and dull back pain.  A watery or bloody mucus discharge that comes from the vagina.  Leaking of amniotic fluid. This is also known as your "water breaking." It could be a slow trickle or a gush. Let your health care provider know if it has a color or strange odor. If you have any of these signs, call your health care provider right away, even if it is before your due date. Follow these instructions at home: Medicines  Follow your health care provider's instructions regarding medicine use. Specific medicines may be either safe or unsafe to take during pregnancy.  Take a prenatal vitamin that contains at least 600 micrograms (mcg) of folic acid.  If you develop constipation, try taking a stool softener if your health care provider approves. Eating and drinking  Eat a balanced diet that includes fresh fruits and vegetables, whole grains, good sources of protein  such as meat, eggs, or tofu, and low-fat dairy. Your health care provider will help you determine the amount of weight gain that is right for you.  Avoid raw meat and uncooked cheese. These carry germs that can cause birth defects in the baby.  If you have low calcium intake from food, talk to your health care provider about whether you should take a daily calcium supplement.  Eat four or five small meals rather than three large meals a day.  Limit foods that are high in fat and processed sugars, such as fried and sweet foods.  To prevent constipation: ? Drink enough fluid to keep your urine clear or pale yellow. ? Eat foods that are high in fiber, such as fresh fruits and vegetables, whole grains, and beans. Activity  Exercise only as directed by your health care provider. Most women can continue their usual exercise routine during pregnancy. Try to exercise for 30 minutes at least 5 days a week. Stop exercising if you experience uterine contractions.  Avoid heavy   lifting.  Do not exercise in extreme heat or humidity, or at high altitudes.  Wear low-heel, comfortable shoes.  Practice good posture.  You may continue to have sex unless your health care provider tells you otherwise. Relieving pain and discomfort  Take frequent breaks and rest with your legs elevated if you have leg cramps or low back pain.  Take warm sitz baths to soothe any pain or discomfort caused by hemorrhoids. Use hemorrhoid cream if your health care provider approves.  Wear a good support bra to prevent discomfort from breast tenderness.  If you develop varicose veins: ? Wear support pantyhose or compression stockings as told by your healthcare provider. ? Elevate your feet for 15 minutes, 3-4 times a day. Prenatal care  Write down your questions. Take them to your prenatal visits.  Keep all your prenatal visits as told by your health care provider. This is important. Safety  Wear your seat belt at  all times when driving.  Make a list of emergency phone numbers, including numbers for family, friends, the hospital, and police and fire departments. General instructions  Avoid cat litter boxes and soil used by cats. These carry germs that can cause birth defects in the baby. If you have a cat, ask someone to clean the litter box for you.  Do not travel far distances unless it is absolutely necessary and only with the approval of your health care provider.  Do not use hot tubs, steam rooms, or saunas.  Do not drink alcohol.  Do not use any products that contain nicotine or tobacco, such as cigarettes and e-cigarettes. If you need help quitting, ask your health care provider.  Do not use any medicinal herbs or unprescribed drugs. These chemicals affect the formation and growth of the baby.  Do not douche or use tampons or scented sanitary pads.  Do not cross your legs for long periods of time.  To prepare for the arrival of your baby: ? Take prenatal classes to understand, practice, and ask questions about labor and delivery. ? Make a trial run to the hospital. ? Visit the hospital and tour the maternity area. ? Arrange for maternity or paternity leave through employers. ? Arrange for family and friends to take care of pets while you are in the hospital. ? Purchase a rear-facing car seat and make sure you know how to install it in your car. ? Pack your hospital bag. ? Prepare the baby's nursery. Make sure to remove all pillows and stuffed animals from the baby's crib to prevent suffocation.  Visit your dentist if you have not gone during your pregnancy. Use a soft toothbrush to brush your teeth and be gentle when you floss. Contact a health care provider if:  You are unsure if you are in labor or if your water has broken.  You become dizzy.  You have mild pelvic cramps, pelvic pressure, or nagging pain in your abdominal area.  You have lower back pain.  You have persistent  nausea, vomiting, or diarrhea.  You have an unusual or bad smelling vaginal discharge.  You have pain when you urinate. Get help right away if:  Your water breaks before 37 weeks.  You have regular contractions less than 5 minutes apart before 37 weeks.  You have a fever.  You are leaking fluid from your vagina.  You have spotting or bleeding from your vagina.  You have severe abdominal pain or cramping.  You have rapid weight loss or weight gain.    You have shortness of breath with chest pain.  You notice sudden or extreme swelling of your face, hands, ankles, feet, or legs.  Your baby makes fewer than 10 movements in 2 hours.  You have severe headaches that do not go away when you take medicine.  You have vision changes. Summary  The third trimester is from week 28 through week 40, months 7 through 9. The third trimester is a time when the unborn baby (fetus) is growing rapidly.  During the third trimester, your discomfort may increase as you and your baby continue to gain weight. You may have abdominal, leg, and back pain, sleeping problems, and an increased need to urinate.  During the third trimester your breasts will keep growing and they will continue to become tender. A yellow fluid (colostrum) may leak from your breasts. This is the first milk you are producing for your baby.  False labor is a condition in which you feel small, irregular tightenings of the muscles in the womb (contractions) that eventually go away. These are called Braxton Hicks contractions. Contractions may last for hours, days, or even weeks before true labor sets in.  Signs of labor can include: abdominal cramps; regular contractions that start at 10 minutes apart and become stronger and more frequent with time; watery or bloody mucus discharge that comes from the vagina; increased pelvic pressure and dull back pain; and leaking of amniotic fluid. This information is not intended to replace advice  given to you by your health care provider. Make sure you discuss any questions you have with your health care provider. Document Released: 02/20/2001 Document Revised: 08/04/2015 Document Reviewed: 04/29/2012 Elsevier Interactive Patient Education  2017 [employer]  

## 2017-05-15 ENCOUNTER — Ambulatory Visit (INDEPENDENT_AMBULATORY_CARE_PROVIDER_SITE_OTHER): Admitting: Obstetrics & Gynecology

## 2017-05-15 VITALS — BP 106/66 | HR 89 | Wt 131.0 lb

## 2017-05-15 DIAGNOSIS — O0990 Supervision of high risk pregnancy, unspecified, unspecified trimester: Secondary | ICD-10-CM

## 2017-05-15 DIAGNOSIS — O0993 Supervision of high risk pregnancy, unspecified, third trimester: Secondary | ICD-10-CM

## 2017-05-15 NOTE — Patient Instructions (Signed)
Vaginal Delivery Vaginal delivery means that you will give birth by pushing your baby out of your birth canal (vagina). A team of health care providers will help you before, during, and after vaginal delivery. Birth experiences are unique for every woman and every pregnancy, and birth experiences vary depending on where you choose to give birth. What should I do to prepare for my baby's birth? Before your baby is born, it is important to talk with your health care provider about:  Your labor and delivery preferences. These may include: ? Medicines that you may be given. ? How you will manage your pain. This might include non-medical pain relief techniques or injectable pain relief such as epidural analgesia. ? How you and your baby will be monitored during labor and delivery. ? Who may be in the labor and delivery room with you. ? Your feelings about surgical delivery of your baby (cesarean delivery, or C-section) if this becomes necessary. ? Your feelings about receiving donated blood through an IV tube (blood transfusion) if this becomes necessary.  Whether you are able: ? To take pictures or videos of the birth. ? To eat during labor and delivery. ? To move around, walk, or change positions during labor and delivery.  What to expect after your baby is born, such as: ? Whether delayed umbilical cord clamping and cutting is offered. ? Who will care for your baby right after birth. ? Medicines or tests that may be recommended for your baby. ? Whether breastfeeding is supported in your hospital or birth center. ? How long you will be in the hospital or birth center.  How any medical conditions you have may affect your baby or your labor and delivery experience.  To prepare for your baby's birth, you should also:  Attend all of your health care visits before delivery (prenatal visits) as recommended by your health care provider. This is important.  Prepare your home for your baby's  arrival. Make sure that you have: ? Diapers. ? Baby clothing. ? Feeding equipment. ? Safe sleeping arrangements for you and your baby.  Install a car seat in your vehicle. Have your car seat checked by a certified car seat installer to make sure that it is installed safely.  Think about who will help you with your new baby at home for at least the first several weeks after delivery.  What can I expect when I arrive at the birth center or hospital? Once you are in labor and have been admitted into the hospital or birth center, your health care provider may:  Review your pregnancy history and any concerns you have.  Insert an IV tube into one of your veins. This is used to give you fluids and medicines.  Check your blood pressure, pulse, temperature, and heart rate (vital signs).  Check whether your bag of water (amniotic sac) has broken (ruptured).  Talk with you about your birth plan and discuss pain control options.  Monitoring Your health care provider may monitor your contractions (uterine monitoring) and your baby's heart rate (fetal monitoring). You may need to be monitored:  Often, but not continuously (intermittently).  All the time or for long periods at a time (continuously). Continuous monitoring may be needed if: ? You are taking certain medicines, such as medicine to relieve pain or make your contractions stronger. ? You have pregnancy or labor complications.  Monitoring may be done by:  Placing a special stethoscope or a handheld monitoring device on your abdomen to   check your baby's heartbeat, and feeling your abdomen for contractions. This method of monitoring does not continuously record your baby's heartbeat or your contractions.  Placing monitors on your abdomen (external monitors) to record your baby's heartbeat and the frequency and length of contractions. You may not have to wear external monitors all the time.  Placing monitors inside of your uterus  (internal monitors) to record your baby's heartbeat and the frequency, length, and strength of your contractions. ? Your health care provider may use internal monitors if he or she needs more information about the strength of your contractions or your baby's heart rate. ? Internal monitors are put in place by passing a thin, flexible wire through your vagina and into your uterus. Depending on the type of monitor, it may remain in your uterus or on your baby's head until birth. ? Your health care provider will discuss the benefits and risks of internal monitoring with you and will ask for your permission before inserting the monitors.  Telemetry. This is a type of continuous monitoring that can be done with external or internal monitors. Instead of having to stay in bed, you are able to move around during telemetry. Ask your health care provider if telemetry is an option for you.  Physical exam Your health care provider may perform a physical exam. This may include:  Checking whether your baby is positioned: ? With the head toward your vagina (head-down). This is most common. ? With the head toward the top of your uterus (head-up or breech). If your baby is in a breech position, your health care provider may try to turn your baby to a head-down position so you can deliver vaginally. If it does not seem that your baby can be born vaginally, your provider may recommend surgery to deliver your baby. In rare cases, you may be able to deliver vaginally if your baby is head-up (breech delivery). ? Lying sideways (transverse). Babies that are lying sideways cannot be delivered vaginally.  Checking your cervix to determine: ? Whether it is thinning out (effacing). ? Whether it is opening up (dilating). ? How low your baby has moved into your birth canal.  What are the three stages of labor and delivery?  Normal labor and delivery is divided into the following three stages: Stage 1  Stage 1 is the  longest stage of labor, and it can last for hours or days. Stage 1 includes: ? Early labor. This is when contractions may be irregular, or regular and mild. Generally, early labor contractions are more than 10 minutes apart. ? Active labor. This is when contractions get longer, more regular, more frequent, and more intense. ? The transition phase. This is when contractions happen very close together, are very intense, and may last longer than during any other part of labor.  Contractions generally feel mild, infrequent, and irregular at first. They get stronger, more frequent (about every 2-3 minutes), and more regular as you progress from early labor through active labor and transition.  Many women progress through stage 1 naturally, but you may need help to continue making progress. If this happens, your health care provider may talk with you about: ? Rupturing your amniotic sac if it has not ruptured yet. ? Giving you medicine to help make your contractions stronger and more frequent.  Stage 1 ends when your cervix is completely dilated to 4 inches (10 cm) and completely effaced. This happens at the end of the transition phase. Stage 2  Once   your cervix is completely effaced and dilated to 4 inches (10 cm), you may start to feel an urge to push. It is common for the body to naturally take a rest before feeling the urge to push, especially if you received an epidural or certain other pain medicines. This rest period may last for up to 1-2 hours, depending on your unique labor experience.  During stage 2, contractions are generally less painful, because pushing helps relieve contraction pain. Instead of contraction pain, you may feel stretching and burning pain, especially when the widest part of your baby's head passes through the vaginal opening (crowning).  Your health care provider will closely monitor your pushing progress and your baby's progress through the vagina during stage 2.  Your  health care provider may massage the area of skin between your vaginal opening and anus (perineum) or apply warm compresses to your perineum. This helps it stretch as the baby's head starts to crown, which can help prevent perineal tearing. ? In some cases, an incision may be made in your perineum (episiotomy) to allow the baby to pass through the vaginal opening. An episiotomy helps to make the opening of the vagina larger to allow more room for the baby to fit through.  It is very important to breathe and focus so your health care provider can control the delivery of your baby's head. Your health care provider may have you decrease the intensity of your pushing, to help prevent perineal tearing.  After delivery of your baby's head, the shoulders and the rest of the body generally deliver very quickly and without difficulty.  Once your baby is delivered, the umbilical cord may be cut right away, or this may be delayed for 1-2 minutes, depending on your baby's health. This may vary among health care providers, hospitals, and birth centers.  If you and your baby are healthy enough, your baby may be placed on your chest or abdomen to help maintain the baby's temperature and to help you bond with each other. Some mothers and babies start breastfeeding at this time. Your health care team will dry your baby and help keep your baby warm during this time.  Your baby may need immediate care if he or she: ? Showed signs of distress during labor. ? Has a medical condition. ? Was born too early (prematurely). ? Had a bowel movement before birth (meconium). ? Shows signs of difficulty transitioning from being inside the uterus to being outside of the uterus. If you are planning to breastfeed, your health care team will help you begin a feeding. Stage 3  The third stage of labor starts immediately after the birth of your baby and ends after you deliver the placenta. The placenta is an organ that develops  during pregnancy to provide oxygen and nutrients to your baby in the womb.  Delivering the placenta may require some pushing, and you may have mild contractions. Breastfeeding can stimulate contractions to help you deliver the placenta.  After the placenta is delivered, your uterus should tighten (contract) and become firm. This helps to stop bleeding in your uterus. To help your uterus contract and to control bleeding, your health care provider may: ? Give you medicine by injection, through an IV tube, by mouth, or through your rectum (rectally). ? Massage your abdomen or perform a vaginal exam to remove any blood clots that are left in your uterus. ? Empty your bladder by placing a thin, flexible tube (catheter) into your bladder. ? Encourage   you to breastfeed your baby. After labor is over, you and your baby will be monitored closely to ensure that you are both healthy until you are ready to go home. Your health care team will teach you how to care for yourself and your baby. This information is not intended to replace advice given to you by your health care provider. Make sure you discuss any questions you have with your health care provider. Document Released: 12/06/2007 Document Revised: 09/16/2015 Document Reviewed: 03/13/2015 Elsevier Interactive Patient Education  2018 Else[employer] Inc[employer]  

## 2017-05-15 NOTE — Progress Notes (Signed)
   PRENATAL VISIT NOTE  Subjective:  [name] is a 22 y.o. G2P0010 at 4811w6d being seen today for ongoing prenatal care.  She is currently monitored for the following issues for this low-risk pregnancy and has Supervision of high risk pregnancy, antepartum; Imprisonment and other incarceration; History of drug use; and Limited prenatal care in third trimester on their problem list.  Patient reports no complaints.  Contractions: Irritability. Vag. Bleeding: None.  Movement: Present. Denies leaking of fluid.   The following portions of the patient's history were reviewed and updated as appropriate: allergies, current medications, past family history, past medical history, past social history, past surgical history and problem list. Problem list updated.  Objective:   Vitals:   05/15/17 1332  BP: 106/66  Pulse: 89  Weight: 131 lb (59.4 kg)    Fetal Status: Fetal Heart Rate (bpm): 146 Fundal Height: 37 cm Movement: Present     General:  Alert, oriented and cooperative. Patient is in no acute distress.  Skin: Skin is warm and dry. No rash noted.   Cardiovascular: Normal heart rate noted  Respiratory: Normal respiratory effort, no problems with respiration noted  Abdomen: Soft, gravid, appropriate for gestational age.  Pain/Pressure: Present     Pelvic: Cervical exam deferred        Extremities: Normal range of motion.     Mental Status:  Normal mood and affect. Normal behavior. Normal judgment and thought content.   Assessment and Plan:  Pregnancy: G2P0010 at 4611w6d  1. Supervision of high risk pregnancy, antepartum   Term labor symptoms and general obstetric precautions including but not limited to vaginal bleeding, contractions, leaking of fluid and fetal movement were reviewed in detail with the patient. Please refer to After Visit Summary for other counseling recommendations.  Return in about 1 week (around 05/22/2017).   Scheryl DarterJames Jaeson Molstad, MD

## 2017-05-23 ENCOUNTER — Encounter: Payer: Self-pay | Admitting: Obstetrics and Gynecology

## 2017-05-23 ENCOUNTER — Inpatient Hospital Stay (HOSPITAL_COMMUNITY)
Admission: AD | Admit: 2017-05-23 | Discharge: 2017-05-24 | Disposition: A | Discharge status: Home / Self Care | Payer: Medicaid Other | Source: Ambulatory Visit | Attending: Obstetrics and Gynecology | Admitting: Obstetrics and Gynecology

## 2017-05-23 ENCOUNTER — Encounter (HOSPITAL_COMMUNITY): Payer: Self-pay | Admitting: *Deleted

## 2017-05-23 DIAGNOSIS — R202 Paresthesia of skin: Secondary | ICD-10-CM | POA: Insufficient documentation

## 2017-05-23 DIAGNOSIS — O99513 Diseases of the respiratory system complicating pregnancy, third trimester: Secondary | ICD-10-CM | POA: Insufficient documentation

## 2017-05-23 DIAGNOSIS — R200 Anesthesia of skin: Secondary | ICD-10-CM

## 2017-05-23 DIAGNOSIS — R519 Headache, unspecified: Secondary | ICD-10-CM

## 2017-05-23 DIAGNOSIS — J45909 Unspecified asthma, uncomplicated: Secondary | ICD-10-CM | POA: Insufficient documentation

## 2017-05-23 DIAGNOSIS — O99343 Other mental disorders complicating pregnancy, third trimester: Secondary | ICD-10-CM | POA: Insufficient documentation

## 2017-05-23 DIAGNOSIS — O99333 Smoking (tobacco) complicating pregnancy, third trimester: Secondary | ICD-10-CM | POA: Insufficient documentation

## 2017-05-23 DIAGNOSIS — Z881 Allergy status to other antibiotic agents status: Secondary | ICD-10-CM | POA: Insufficient documentation

## 2017-05-23 DIAGNOSIS — O26893 Other specified pregnancy related conditions, third trimester: Secondary | ICD-10-CM | POA: Insufficient documentation

## 2017-05-23 DIAGNOSIS — Z3A39 39 weeks gestation of pregnancy: Secondary | ICD-10-CM | POA: Insufficient documentation

## 2017-05-23 DIAGNOSIS — F17210 Nicotine dependence, cigarettes, uncomplicated: Secondary | ICD-10-CM | POA: Insufficient documentation

## 2017-05-23 DIAGNOSIS — R51 Headache: Secondary | ICD-10-CM | POA: Insufficient documentation

## 2017-05-23 DIAGNOSIS — Z79899 Other long term (current) drug therapy: Secondary | ICD-10-CM | POA: Insufficient documentation

## 2017-05-23 DIAGNOSIS — F419 Anxiety disorder, unspecified: Secondary | ICD-10-CM | POA: Insufficient documentation

## 2017-05-23 NOTE — MAU Note (Addendum)
Starting feeling light headed all day today.  Hands feeling like they are going numb.  Also feeling pressure for the past couple of days.  Reports good FM.  No LOF/VB.  Had a sharp pain in her lower abdomen yesterday but no abdominal pain now.  Headaches off and on.

## 2017-05-23 NOTE — MAU Provider Note (Signed)
Chief Complaint:  Headache   First Provider Initiated Contact with Patient 05/23/17 2350     HPI: [name] is a 22 y.o. G2P0010 at 39w0dwho presents to maternity admissions reporting headache on top of head with intermittent tingling of right hand and arm.  States tingling is gone now. Did not try any tylenol or other remedy.   While talking, patient is laughing and joking with FOB and friend, very animated.  . She reports good fetal movement, denies LOF, vaginal bleeding, vaginal itching/burning, urinary symptoms, dizziness, n/v, diarrhea, constipation or fever/chills.  She denies visual changes.   Headache   This is a new problem. The current episode started today. The problem occurs constantly. The problem has been unchanged. The pain is located in the vertex region. The pain does not radiate. The quality of the pain is described as sharp. The pain is moderate. Associated symptoms include numbness (and tingling). Pertinent negatives include no back pain, blurred vision, eye pain, fever, loss of balance, muscle aches, nausea, photophobia or sinus pressure. Nothing aggravates the symptoms. She has tried nothing for the symptoms.    RN Note: PT SAYS FOREHEAD  AND TEMPLES  STARTED HURTING  AND TINGLING THIS AM  -  NO MEDS .   NOW   NO TINGLING  BUT  HURTS.   NEVER HAPPENED  BEFORE .   DENIES UC.   [facility] WITH CLINIC.   HAD AN APPOINTMENT TODAY - DID NOT GO- DAD HAD CAR.   NO VE IN  CLINIC.  FEELS LOWER ABD PRESSURE     Past Medical History: Past Medical History:  Diagnosis Date  . Anxiety   . Asthma   . Headache     Past obstetric history: OB History  Gravida Para Term Preterm AB Living  2 0 0 0 1    SAB TAB Ectopic Multiple Live Births  1 0 0        # Outcome Date GA Lbr Len/2nd Weight Sex Delivery Anes PTL Lv  2 Current           1 SAB 2015              Past Surgical History: Past Surgical History:  Procedure Laterality Date  . FRACTURE SURGERY    . ORTHOPEDIC SURGERY       Family History: Family History  Problem Relation Age of Onset  . COPD Mother   . Heart disease Father     Social History: Social History   Tobacco Use  . Smoking status: Current Some Day Smoker    Packs/day: 1.00    Years: 3.00    Pack years: 3.00    Types: Cigarettes  . Smokeless tobacco: Never Used  Substance Use Topics  . Alcohol use: No  . Drug use: No    Allergies:  Allergies  Allergen Reactions  . Benadryl [Diphenhydramine Hcl (Sleep)] Hives and Other (See Comments)    Pt states rxn worsened with diphenhydramine.  . Vancomycin Itching  . Diphenhydramine Rash  . Peanut-Containing Drug Products Rash    Meds:  Medications Prior to Admission  Medication Sig Dispense Refill Last Dose  . ondansetron (ZOFRAN) 4 MG tablet Take 1 tablet (4 mg total) by mouth every 8 (eight) hours as needed for nausea or vomiting. 12 tablet 0 05/23/2017 at Unknown time  . Prenatal Multivit-Min-Fe-FA (PRENATAL VITAMINS PO) Take by mouth.   05/23/2017 at Unknown time  . lactulose (CEPHULAC) 10 g packet Take 10 g by mouth  3 (three) times daily.   More than a month at Unknown time    I have reviewed patient's Past Medical Hx, Surgical Hx, Family Hx, Social Hx, medications and allergies.   ROS:  Review of Systems  Constitutional: Negative for appetite change, chills, fatigue and fever.  HENT: Negative for congestion and sinus pressure.   Eyes: Negative for blurred vision, photophobia, pain and visual disturbance.  Respiratory: Negative for shortness of breath.   Gastrointestinal: Negative for nausea.  Genitourinary: Negative for dysuria, flank pain and vaginal bleeding.  Musculoskeletal: Negative for back pain.  Neurological: Positive for numbness (and tingling) and headaches. Negative for loss of balance.   Other systems negative  Physical Exam   Patient Vitals for the past 24 hrs:  BP Temp Pulse Resp Height Weight  05/23/17 2310 113/65 98.6 F (37 C) (!) 101 19 5\' 1"  (1.549  m) 135 lb (61.2 kg)   Vitals:   05/23/17 2310 05/24/17 0042  BP: 113/65 111/61  Pulse: (!) 101 86  Resp: 19   Temp: 98.6 F (37 C)   Weight: 135 lb (61.2 kg)   Height: 5\' 1"  (1.549 m)     Constitutional: Well-developed, well-nourished female in no acute distress.  Cardiovascular: normal rate and rhythm Respiratory: normal effort, clear to auscultation bilaterally GI: Abd soft, non-tender, gravid appropriate for gestational age.   No rebound or guarding. MS: Extremities nontender, no edema, normal ROM Neurologic: Alert and oriented x 4.  GU: Neg CVAT.  PELVIC EXAM:  not indicated  FHT:  Baseline 140 , moderate variability, accelerations present, no decelerations Contractions:  Irregular     Labs: No results found for this or any previous visit (from the past 24 hour(s)). O/Positive/-- (01/09 1139)  Imaging:  No results found.  MAU Course/MDM: I have ordered labs and reviewed results.  NST reviewed and found reactive.  Patient does not feel contractions.   Treatments in MAU included EFM, Tylenol which relieved headache.    Assessment: Single IUP at 4334w1d Headache, resolving Intermittent tingling of hands, possibly nerve compression, now resolved  Plan: Discharge home Labor precautions and fetal kick counts Follow up in Office for prenatal visits and recheck  Encouraged to return here or to other Urgent Care/ED if she develops worsening of symptoms, increase in pain, fever, or other concerning symptoms.  Pt stable at time of discharge.  Wynelle BourgeoisMarie Williams CNM, MSN Certified Nurse-Midwife 05/23/2017 11:50 PM

## 2017-05-23 NOTE — MAU Note (Signed)
PT SAYS FOREHEAD  AND TEMPLES  STARTED HURTING  AND TINGLING THIS AM  -  NO MEDS .   NOW   NO TINGLING  BUT  HURTS.   NEVER HAPPENED  BEFORE .   DENIES UC.   [facility]   HAD AN APPOINTMENT TODAY - DID NOT GO- DAD HAD CAR.   NO VE IN  CLINIC.  FEELS LOWER ABD PRESSURE

## 2017-05-24 DIAGNOSIS — F17210 Nicotine dependence, cigarettes, uncomplicated: Secondary | ICD-10-CM | POA: Diagnosis not present

## 2017-05-24 DIAGNOSIS — O99333 Smoking (tobacco) complicating pregnancy, third trimester: Secondary | ICD-10-CM | POA: Diagnosis not present

## 2017-05-24 DIAGNOSIS — R51 Headache: Secondary | ICD-10-CM

## 2017-05-24 DIAGNOSIS — F419 Anxiety disorder, unspecified: Secondary | ICD-10-CM | POA: Diagnosis not present

## 2017-05-24 DIAGNOSIS — O99343 Other mental disorders complicating pregnancy, third trimester: Secondary | ICD-10-CM | POA: Diagnosis not present

## 2017-05-24 DIAGNOSIS — O26893 Other specified pregnancy related conditions, third trimester: Secondary | ICD-10-CM | POA: Diagnosis present

## 2017-05-24 DIAGNOSIS — J45909 Unspecified asthma, uncomplicated: Secondary | ICD-10-CM | POA: Diagnosis not present

## 2017-05-24 DIAGNOSIS — R202 Paresthesia of skin: Secondary | ICD-10-CM | POA: Diagnosis present

## 2017-05-24 DIAGNOSIS — Z79899 Other long term (current) drug therapy: Secondary | ICD-10-CM | POA: Diagnosis not present

## 2017-05-24 DIAGNOSIS — Z3A39 39 weeks gestation of pregnancy: Secondary | ICD-10-CM

## 2017-05-24 DIAGNOSIS — Z881 Allergy status to other antibiotic agents status: Secondary | ICD-10-CM | POA: Diagnosis not present

## 2017-05-24 DIAGNOSIS — O99513 Diseases of the respiratory system complicating pregnancy, third trimester: Secondary | ICD-10-CM | POA: Diagnosis not present

## 2017-05-24 DIAGNOSIS — O9989 Other specified diseases and conditions complicating pregnancy, childbirth and the puerperium: Secondary | ICD-10-CM

## 2017-05-24 MED ORDER — ACETAMINOPHEN 325 MG PO TABS: 650.0000 mg | ORAL_TABLET | Freq: Four times a day (QID) | ORAL | 1 refills | Status: DC | PRN

## 2017-05-24 MED ORDER — ACETAMINOPHEN 500 MG PO TABS
1000.0000 mg | ORAL_TABLET | Freq: Once | ORAL | Status: AC
  Administered 2017-05-24: 1000 mg via ORAL
  Filled 2017-05-24: qty 2

## 2017-05-24 NOTE — Discharge Instructions (Signed)
Paresthesia Paresthesia is a burning or prickling feeling. This feeling can happen in any part of the body. It often happens in the hands, arms, legs, or feet. Usually, it is not painful. In most cases, the feeling goes away in a short time and is not a sign of a serious problem. Follow these instructions at home:  Avoid drinking alcohol.  Try massage or needle therapy (acupuncture) to help with your problems.  Keep all follow-up visits as told by your doctor. This is important. Contact a doctor if:  You keep on having episodes of paresthesia.  Your burning or prickling feeling gets worse when you walk.  You have pain or cramps.  You feel dizzy.  You have a rash. Get help right away if:  You feel weak.  You have trouble walking or moving.  You have problems speaking, understanding, or seeing.  You feel confused.  You cannot control when you pee (urinate) or poop (bowel movement).  You lose feeling (numbness) after an injury.  You pass out (faint). This information is not intended to replace advice given to you by your health care provider. Make sure you discuss any questions you have with your health care provider. Document Released: 02/09/2008 Document Revised: 08/04/2015 Document Reviewed: 02/22/2014 Elsevier Interactive Patient Education  2018 [employer] General Headache Without Cause A headache is pain or discomfort felt around the head or neck area. There are many causes and types of headaches. In some cases, the cause may not be found. Follow these instructions at home: Managing pain  Take over-the-counter and prescription medicines only as told by your doctor.  Lie down in a dark, quiet room when you have a headache.  If directed, apply ice to the head and neck area: ? Put ice in a plastic bag. ? Place a towel between your skin and the bag. ? Leave the ice on for 20 minutes, 2-3 times per day.  Use a heating pad or hot shower to apply heat to the head  and neck area as told by your doctor.  Keep lights dim if bright lights bother you or make your headaches worse. Eating and drinking  Eat meals on a regular schedule.  Lessen how much alcohol you drink.  Lessen how much caffeine you drink, or stop drinking caffeine. General instructions  Keep all follow-up visits as told by your doctor. This is important.  Keep a journal to find out if certain things bring on headaches. For example, write down: ? What you eat and drink. ? How much sleep you get. ? Any change to your diet or medicines.  Relax by getting a massage or doing other relaxing activities.  Lessen stress.  Sit up straight. Do not tighten (tense) your muscles.  Do not use tobacco products. This includes cigarettes, chewing tobacco, or e-cigarettes. If you need help quitting, ask your doctor.  Exercise regularly as told by your doctor.  Get enough sleep. This often means 7-9 hours of sleep. Contact a doctor if:  Your symptoms are not helped by medicine.  You have a headache that feels different than the other headaches.  You feel sick to your stomach (nauseous) or you throw up (vomit).  You have a fever. Get help right away if:  Your headache becomes really bad.  You keep throwing up.  You have a stiff neck.  You have trouble seeing.  You have trouble speaking.  You have pain in the eye or ear.  Your muscles are weak or  you lose muscle control.  You lose your balance or have trouble walking.  You feel like you will pass out (faint) or you pass out.  You have confusion. This information is not intended to replace advice given to you by your health care provider. Make sure you discuss any questions you have with your health care provider. Document Released: 12/06/2007 Document Revised: 08/04/2015 Document Reviewed: 06/21/2014 Elsevier Interactive Patient Education  [employer].

## 2017-06-03 ENCOUNTER — Encounter (HOSPITAL_COMMUNITY): Payer: Self-pay | Admitting: *Deleted

## 2017-06-03 ENCOUNTER — Ambulatory Visit: Payer: Self-pay

## 2017-06-03 ENCOUNTER — Ambulatory Visit (INDEPENDENT_AMBULATORY_CARE_PROVIDER_SITE_OTHER): Payer: Medicaid Other | Admitting: Obstetrics & Gynecology

## 2017-06-03 ENCOUNTER — Telehealth (HOSPITAL_COMMUNITY): Payer: Self-pay | Admitting: *Deleted

## 2017-06-03 ENCOUNTER — Encounter: Payer: Self-pay | Admitting: *Deleted

## 2017-06-03 VITALS — BP 114/74 | HR 99 | Wt 137.2 lb

## 2017-06-03 DIAGNOSIS — O480 Post-term pregnancy: Secondary | ICD-10-CM

## 2017-06-03 DIAGNOSIS — O0990 Supervision of high risk pregnancy, unspecified, unspecified trimester: Secondary | ICD-10-CM

## 2017-06-03 DIAGNOSIS — O0993 Supervision of high risk pregnancy, unspecified, third trimester: Secondary | ICD-10-CM | POA: Diagnosis not present

## 2017-06-03 NOTE — Patient Instructions (Signed)
Return to clinic for any scheduled appointments or obstetric concerns, or go to [facility] for evaluation   Labor Induction Labor induction is when steps are taken to cause a pregnant woman to begin the labor process. Most women go into labor on their own between 37 weeks and 42 weeks of the pregnancy. When this does not happen or when there is a medical need, methods may be used to induce labor. Labor induction causes a pregnant woman's uterus to contract. It also causes the cervix to soften (ripen), open (dilate), and thin out (efface). Usually, labor is not induced before 39 weeks of the pregnancy unless there is a problem with the baby or mother. Before inducing labor, your health care provider will consider a number of factors, including the following:  The medical condition of you and the baby.  How many weeks along you are.  The status of the baby's lung maturity.  The condition of the cervix.  The position of the baby.  What are the reasons for labor induction? Labor may be induced for the following reasons:  The health of the baby or mother is at risk.  The pregnancy is overdue by 1 week or more.  The water breaks but labor does not start on its own.  The mother has a health condition or serious illness, such as high blood pressure, infection, placental abruption, or diabetes.  The amniotic fluid amounts are low around the baby.  The baby is distressed.  Convenience or wanting the baby to be born on a certain date is not a reason for inducing labor. What methods are used for labor induction? Several methods of labor induction may be used, such as:  Prostaglandin medicine. This medicine causes the cervix to dilate and ripen. The medicine will also start contractions. It can be taken by mouth or by inserting a suppository into the vagina.  Inserting a thin tube (catheter) with a balloon on the end into the vagina to dilate the cervix. Once inserted, the balloon is expanded  with water, which causes the cervix to open.  Stripping the membranes. Your health care provider separates amniotic sac tissue from the cervix, causing the cervix to be stretched and causing the release of a hormone called progesterone. This may cause the uterus to contract. It is often done during an office visit. You will be sent home to wait for the contractions to begin. You will then come in for an induction.  Breaking the water. Your health care provider makes a hole in the amniotic sac using a small instrument. Once the amniotic sac breaks, contractions should begin. This may still take hours to see an effect.  Medicine to trigger or strengthen contractions. This medicine is given through an IV access tube inserted into a vein in your arm.  All of the methods of induction, besides stripping the membranes, will be done in the hospital. Induction is done in the hospital so that you and the baby can be carefully monitored. How long does it take for labor to be induced? Some inductions can take up to 2-3 days. Depending on the cervix, it usually takes less time. It takes longer when you are induced early in the pregnancy or if this is your first pregnancy. If a mother is still pregnant and the induction has been going on for 2-3 days, either the mother will be sent home or a cesarean delivery will be needed. What are the risks associated with labor induction? Some of the risks   of induction include:  Changes in fetal heart rate, such as too high, too low, or erratic.  Fetal distress.  Chance of infection for the mother and baby.  Increased chance of having a cesarean delivery.  Breaking off (abruption) of the placenta from the uterus (rare).  Uterine rupture (very rare).  When induction is needed for medical reasons, the benefits of induction may outweigh the risks. What are some reasons for not inducing labor? Labor induction should not be done if:  It is shown that your baby does  not tolerate labor.  You have had previous surgeries on your uterus, such as a myomectomy or the removal of fibroids.  Your placenta lies very low in the uterus and blocks the opening of the cervix (placenta previa).  Your baby is not in a head-down position.  The umbilical cord drops down into the birth canal in front of the baby. This could cut off the baby's blood and oxygen supply.  You have had a previous cesarean delivery.  There are unusual circumstances, such as the baby being extremely premature.  This information is not intended to replace advice given to you by your health care provider. Make sure you discuss any questions you have with your health care provider. Document Released: 07/18/2006 Document Revised: 08/04/2015 Document Reviewed: 09/25/2012 Elsevier Interactive Patient Education  2017 Elsevier Inc.  

## 2017-06-03 NOTE — Progress Notes (Signed)
   PRENATAL VISIT NOTE  Subjective:  Opal SidlesHeather N Swearingin is a 22 y.o. G2P0010 at 8992w4d being seen today for ongoing prenatal care.  She is currently monitored for the following issues for this low-risk pregnancy and has Supervision of high risk pregnancy, antepartum; Imprisonment and other incarceration; History of drug use; and Limited prenatal care in third trimester on their problem list.  Patient reports no complaints.  Contractions: Not present. Vag. Bleeding: None.  Movement: Present. Denies leaking of fluid.   The following portions of the patient's history were reviewed and updated as appropriate: allergies, current medications, past family history, past medical history, past social history, past surgical history and problem list. Problem list updated.  Objective:   Vitals:   06/03/17 1041  BP: 114/74  Pulse: 99  Weight: 137 lb 3.2 oz (62.2 kg)    Fetal Status: Fetal Heart Rate (bpm): NST   Movement: Present     General:  Alert, oriented and cooperative. Patient is in no acute distress.  Skin: Skin is warm and dry. No rash noted.   Cardiovascular: Normal heart rate noted  Respiratory: Normal respiratory effort, no problems with respiration noted  Abdomen: Soft, gravid, appropriate for gestational age.  Pain/Pressure: Present     Pelvic: Cervical exam deferred        Extremities: Normal range of motion.  Edema: Trace  Mental Status:  Normal mood and affect. Normal behavior. Normal judgment and thought content.   Assessment and Plan:  Pregnancy: G2P0010 at 2492w4d  1. Post term pregnancy over 40 weeks NST performed today was reviewed and was found to be reactive. Subsequent BPP performed today was also reviewed and was found to be 10/10. AFI was also normal. Continue recommended antenatal testing and prenatal care.  2. Supervision of high risk pregnancy, antepartum Term labor symptoms and general obstetric precautions including but not limited to vaginal bleeding, contractions,  leaking of fluid and fetal movement were reviewed in detail with the patient. Please refer to After Visit Summary for other counseling recommendations.  Return in about 5 weeks (around 07/08/2017) for Postpartum check.   Jaynie CollinsUgonna Icarus Partch, MD

## 2017-06-03 NOTE — Telephone Encounter (Signed)
Preadmission screen  

## 2017-06-06 ENCOUNTER — Inpatient Hospital Stay (HOSPITAL_COMMUNITY)
Admission: RE | Admit: 2017-06-06 | Discharge: 2017-06-09 | DRG: 806 | Disposition: A | Discharge status: Home / Self Care | Payer: Medicaid Other | Source: Ambulatory Visit | Attending: Obstetrics and Gynecology | Admitting: Obstetrics and Gynecology

## 2017-06-06 ENCOUNTER — Encounter (HOSPITAL_COMMUNITY): Payer: Self-pay

## 2017-06-06 ENCOUNTER — Inpatient Hospital Stay (HOSPITAL_COMMUNITY): Payer: Medicaid Other | Admitting: Anesthesiology

## 2017-06-06 DIAGNOSIS — O99334 Smoking (tobacco) complicating childbirth: Secondary | ICD-10-CM | POA: Diagnosis present

## 2017-06-06 DIAGNOSIS — O480 Post-term pregnancy: Principal | ICD-10-CM | POA: Diagnosis present

## 2017-06-06 DIAGNOSIS — F1290 Cannabis use, unspecified, uncomplicated: Secondary | ICD-10-CM | POA: Diagnosis present

## 2017-06-06 DIAGNOSIS — F1991 Other psychoactive substance use, unspecified, in remission: Secondary | ICD-10-CM

## 2017-06-06 DIAGNOSIS — O0933 Supervision of pregnancy with insufficient antenatal care, third trimester: Secondary | ICD-10-CM

## 2017-06-06 DIAGNOSIS — Z3A41 41 weeks gestation of pregnancy: Secondary | ICD-10-CM | POA: Diagnosis not present

## 2017-06-06 DIAGNOSIS — F17210 Nicotine dependence, cigarettes, uncomplicated: Secondary | ICD-10-CM | POA: Diagnosis present

## 2017-06-06 DIAGNOSIS — Z880 Allergy status to penicillin: Secondary | ICD-10-CM | POA: Diagnosis not present

## 2017-06-06 DIAGNOSIS — O99324 Drug use complicating childbirth: Secondary | ICD-10-CM | POA: Diagnosis present

## 2017-06-06 DIAGNOSIS — O0990 Supervision of high risk pregnancy, unspecified, unspecified trimester: Secondary | ICD-10-CM

## 2017-06-06 DIAGNOSIS — Z87898 Personal history of other specified conditions: Secondary | ICD-10-CM

## 2017-06-06 DIAGNOSIS — O09299 Supervision of pregnancy with other poor reproductive or obstetric history, unspecified trimester: Secondary | ICD-10-CM

## 2017-06-06 LAB — CBC
HCT: 40.5 % (ref 36.0–46.0)
Hemoglobin: 14 g/dL (ref 12.0–15.0)
MCH: 30.8 pg (ref 26.0–34.0)
MCHC: 34.6 g/dL (ref 30.0–36.0)
MCV: 89.2 fL (ref 78.0–100.0)
PLATELETS: 217 10*3/uL (ref 150–400)
RBC: 4.54 MIL/uL (ref 3.87–5.11)
RDW: 15.2 % (ref 11.5–15.5)
WBC: 18.3 10*3/uL — ABNORMAL HIGH (ref 4.0–10.5)

## 2017-06-06 LAB — RAPID URINE DRUG SCREEN, HOSP PERFORMED
Amphetamines: NOT DETECTED
Barbiturates: NOT DETECTED
Benzodiazepines: NOT DETECTED
Cocaine: NOT DETECTED
Opiates: NOT DETECTED
Tetrahydrocannabinol: POSITIVE — AB

## 2017-06-06 LAB — TYPE AND SCREEN
ABO/RH(D): O POS
Antibody Screen: NEGATIVE

## 2017-06-06 LAB — ABO/RH: ABO/RH(D): O POS

## 2017-06-06 LAB — RPR: RPR: NONREACTIVE

## 2017-06-06 MED ORDER — EPHEDRINE 5 MG/ML INJ: 10.0000 mg | INTRAVENOUS | Status: DC | PRN

## 2017-06-06 MED ORDER — MISOPROSTOL 25 MCG QUARTER TABLET
25.0000 ug | ORAL_TABLET | ORAL | Status: DC | PRN
  Administered 2017-06-07: 800 ug via BUCCAL

## 2017-06-06 MED ORDER — FLEET ENEMA 7-19 GM/118ML RE ENEM: 1.0000 | ENEMA | Freq: Every day | RECTAL | Status: DC | PRN

## 2017-06-06 MED ORDER — ONDANSETRON HCL 4 MG/2ML IJ SOLN
4.0000 mg | Freq: Four times a day (QID) | INTRAMUSCULAR | Status: DC | PRN
  Administered 2017-06-06 – 2017-06-07 (×4): 4 mg via INTRAVENOUS
  Filled 2017-06-06 (×4): qty 2

## 2017-06-06 MED ORDER — MISOPROSTOL 25 MCG QUARTER TABLET
25.0000 ug | ORAL_TABLET | ORAL | Status: DC | PRN
  Administered 2017-06-06 (×2): 25 ug via VAGINAL
  Filled 2017-06-06 (×2): qty 1

## 2017-06-06 MED ORDER — LACTATED RINGERS IV SOLN
500.0000 mL | INTRAVENOUS | Status: DC | PRN
  Administered 2017-06-06: 500 mL via INTRAVENOUS

## 2017-06-06 MED ORDER — LACTATED RINGERS IV SOLN: 500.0000 mL | Freq: Once | INTRAVENOUS | Status: DC

## 2017-06-06 MED ORDER — TERBUTALINE SULFATE 1 MG/ML IJ SOLN: 0.2500 mg | Freq: Once | INTRAMUSCULAR | Status: DC | PRN

## 2017-06-06 MED ORDER — PHENYLEPHRINE 40 MCG/ML (10ML) SYRINGE FOR IV PUSH (FOR BLOOD PRESSURE SUPPORT)
80.0000 ug | PREFILLED_SYRINGE | INTRAVENOUS | Status: DC | PRN
  Filled 2017-06-06: qty 10

## 2017-06-06 MED ORDER — LIDOCAINE HCL (PF) 1 % IJ SOLN
INTRAMUSCULAR | Status: DC | PRN
  Administered 2017-06-06 (×2): 5 mL via EPIDURAL

## 2017-06-06 MED ORDER — NICOTINE 7 MG/24HR TD PT24
7.0000 mg | MEDICATED_PATCH | Freq: Every day | TRANSDERMAL | Status: DC
  Administered 2017-06-06 – 2017-06-07 (×2): 7 mg via TRANSDERMAL
  Filled 2017-06-06 (×4): qty 1

## 2017-06-06 MED ORDER — FENTANYL 2.5 MCG/ML BUPIVACAINE 1/10 % EPIDURAL INFUSION (WH - ANES)
14.0000 mL/h | INTRAMUSCULAR | Status: DC | PRN
  Administered 2017-06-06 – 2017-06-07 (×4): 14 mL/h via EPIDURAL
  Filled 2017-06-06 (×4): qty 100

## 2017-06-06 MED ORDER — LIDOCAINE HCL (PF) 1 % IJ SOLN
30.0000 mL | INTRAMUSCULAR | Status: DC | PRN
  Administered 2017-06-07: 30 mL via SUBCUTANEOUS
  Filled 2017-06-06: qty 30

## 2017-06-06 MED ORDER — HYDROXYZINE HCL 50 MG PO TABS
50.0000 mg | ORAL_TABLET | Freq: Four times a day (QID) | ORAL | Status: DC | PRN
  Administered 2017-06-06: 50 mg via ORAL
  Filled 2017-06-06: qty 1

## 2017-06-06 MED ORDER — OXYTOCIN 40 UNITS IN LACTATED RINGERS INFUSION - SIMPLE MED
1.0000 m[IU]/min | INTRAVENOUS | Status: DC
  Filled 2017-06-06 (×2): qty 1000

## 2017-06-06 MED ORDER — FENTANYL CITRATE (PF) 100 MCG/2ML IJ SOLN
50.0000 ug | INTRAMUSCULAR | Status: DC | PRN
  Administered 2017-06-06 (×2): 100 ug via INTRAVENOUS
  Administered 2017-06-06: 50 ug via INTRAVENOUS
  Filled 2017-06-06 (×3): qty 2

## 2017-06-06 MED ORDER — ZOLPIDEM TARTRATE 5 MG PO TABS: 5.0000 mg | ORAL_TABLET | Freq: Every evening | ORAL | Status: DC | PRN

## 2017-06-06 MED ORDER — SOD CITRATE-CITRIC ACID 500-334 MG/5ML PO SOLN
30.0000 mL | ORAL | Status: DC | PRN
  Administered 2017-06-06 (×2): 30 mL via ORAL
  Filled 2017-06-06 (×2): qty 15

## 2017-06-06 MED ORDER — OXYTOCIN BOLUS FROM INFUSION: 500.0000 mL | Freq: Once | INTRAVENOUS | Status: DC

## 2017-06-06 MED ORDER — OXYTOCIN 40 UNITS IN LACTATED RINGERS INFUSION - SIMPLE MED: 2.5000 [IU]/h | INTRAVENOUS | Status: DC

## 2017-06-06 MED ORDER — OXYCODONE-ACETAMINOPHEN 5-325 MG PO TABS
1.0000 | ORAL_TABLET | ORAL | Status: DC | PRN
Start: 2017-06-06 — End: 2017-06-07

## 2017-06-06 MED ORDER — LACTATED RINGERS IV SOLN
INTRAVENOUS | Status: DC
  Administered 2017-06-06 – 2017-06-07 (×5): via INTRAVENOUS

## 2017-06-06 MED ORDER — PHENYLEPHRINE 40 MCG/ML (10ML) SYRINGE FOR IV PUSH (FOR BLOOD PRESSURE SUPPORT): 80.0000 ug | PREFILLED_SYRINGE | INTRAVENOUS | Status: DC | PRN

## 2017-06-06 MED ORDER — OXYCODONE-ACETAMINOPHEN 5-325 MG PO TABS
2.0000 | ORAL_TABLET | ORAL | Status: DC | PRN
Start: 2017-06-06 — End: 2017-06-07

## 2017-06-06 MED ORDER — ACETAMINOPHEN 325 MG PO TABS
650.0000 mg | ORAL_TABLET | ORAL | Status: DC | PRN
  Administered 2017-06-07: 650 mg via ORAL
  Filled 2017-06-06: qty 2

## 2017-06-06 NOTE — Anesthesia Procedure Notes (Signed)
Epidural Patient location during procedure: OB Start time: 06/06/2017 6:13 PM End time: 06/06/2017 6:23 PM  Staffing Anesthesiologist: Leonides GrillsEllender, Donat Humble P, MD Performed: anesthesiologist   Preanesthetic Checklist Completed: patient identified, site marked, pre-op evaluation, timeout performed, IV checked, risks and benefits discussed and monitors and equipment checked  Epidural Patient position: sitting Prep: DuraPrep Patient monitoring: heart rate, cardiac monitor, continuous pulse ox and blood pressure Approach: midline Location: L4-L5 Injection technique: LOR air  Needle:  Needle type: Tuohy  Needle gauge: 17 G Needle length: 9 cm Needle insertion depth: 4 cm Catheter type: closed end flexible Catheter size: 19 Gauge Catheter at skin depth: 9 cm Test dose: negative and Other  Assessment Events: blood not aspirated, injection not painful, no injection resistance and negative IV test  Additional Notes Informed consent obtained prior to proceeding including risk of failure, 1% risk of PDPH, risk of minor discomfort and bruising. Discussed alternatives to epidural analgesia and patient desires to proceed.  Timeout performed pre-procedure verifying patient name, procedure, and platelet count.  Patient tolerated procedure well. Reason for block:procedure for pain

## 2017-06-06 NOTE — Anesthesia Pain Management Evaluation Note (Signed)
  [facility] Pain Management Visit Note  Patient: Opal SidlesHeather N Lenzo, 22 y.o., female  "Hello I am a member of the anesthesia team at [facility]. We have an anesthesia team available at all times to provide care throughout the hospital, including epidural management and anesthesia for C-section. I don't know your plan for the delivery whether it a natural birth, water birth, IV sedation, nitrous supplementation, doula or epidural, but we want to meet your pain goals."   1.Was your pain managed to your expectations on prior hospitalizations?   No prior hospitalizations  2.What is your expectation for pain management during this hospitalization?     Epidural  3.How can we help you reach that goal? epidural  Record the patient's initial score and the patient's pain goal.   Pain: 0  Pain Goal: 4 [facility] wants you to be able to say your pain was always managed very well.  Pookela Sellin 06/06/2017

## 2017-06-06 NOTE — Progress Notes (Signed)
LABOR PROGRESS NOTE  [name] is a 22 y.o. G2P0010 at 348w0d  admitted for IOL for postdates   Subjective: Patient doing well. Not feeling any contractions or cramping.   Objective: BP 118/69   Pulse 75   Temp 98.1 F (36.7 C) (Oral)   Resp 20   Ht 5\' 1"  (1.549 m)   Wt 137 lb 11.2 oz (62.5 kg)   LMP 09/15/2016 (Approximate)   BMI 26.02 kg/m  or  Vitals:   06/06/17 1225 06/06/17 1328 06/06/17 1335 06/06/17 1435  BP:  120/76  118/69  Pulse:  78  75  Resp: 18 20 18 20   Temp:  98.1 F (36.7 C)    TempSrc:  Oral    Weight:      Height:        FB placed @ 1335 Dilation: 1 Effacement (%): Thick Cervical Position: Posterior Station: -3 Presentation: Vertex Exam by:: Lanice ShirtsV. Jeriko Kowalke, CNM FHT: baseline rate 125, moderate varibility, +acel, no decel Toco: 3-5 minutes/ mild by palpation   Labs: Lab Results  Component Value Date   WBC 18.3 (H) 06/06/2017   HGB 14.0 06/06/2017   HCT 40.5 06/06/2017   MCV 89.2 06/06/2017   PLT 217 06/06/2017    Patient Active Problem List   Diagnosis Date Noted  . Labor and delivery indication for care or intervention 06/06/2017  . Marijuana use 06/06/2017  . Limited prenatal care in third trimester 03/27/2017  . Imprisonment and other incarceration 03/23/2017  . History of drug use 03/23/2017  . Supervision of high risk pregnancy, antepartum 03/20/2017    Assessment / Plan: [age] y.o. G2P0010 at 2288w0d here for IOL for postdates   Labor: Progressing well off one cytotec, FB placed @1335  Fetal Wellbeing:  Cat I Pain Control:  PRN medications ordered, plans epidural  Anticipated MOD:  SVD  Sharyon CableRogers, Pryor Guettler C, CNM 06/06/2017, 2:59 PM

## 2017-06-06 NOTE — H&P (Addendum)
OBSTETRIC ADMISSION HISTORY AND PHYSICAL  [name] is a 22 y.o. female G2P0010 with IUP at [redacted]w[redacted]d by [place] presenting for IOL due to post term pregnancy. She reports +FMs, No LOF, no VB, no blurry vision, headaches or peripheral edema, and RUQ pain.  She plans on breast and bottle feeding. She request IUD for birth control. She received her prenatal [facility] at [facility] starting at 27 weeks. Limited prenatal care in third trimester.  Dating: By [place] --->  Estimated Date of Delivery: 05/30/17  Sono:   @[redacted]w[redacted]d , CWD, normal anatomy, cephalic presentation, posterior lie, 2559g, 66% EFW  Prenatal History/Complications: Limited prenatal care in third trimester, use of marijuana during pregnancy, use of tobacco during pregnancy, Hx of cocaine prior to pregnancy.   Past Medical History: Past Medical History:  Diagnosis Date  . Anxiety   . Asthma   . Headache     Past Surgical History: Past Surgical History:  Procedure Laterality Date  . FRACTURE SURGERY    . ORTHOPEDIC SURGERY      Obstetrical History: OB History    Gravida  2   Para  0   Term  0   Preterm  0   AB  1   Living        SAB  1   TAB  0   Ectopic  0   Multiple      Live Births              Social History: Social History   Socioeconomic History  . Marital status: Single    Spouse name: Not on file  . Number of children: Not on file  . Years of education: Not on file  . Highest education level: Not on file  Occupational History  . Not on file  Social Needs  . Financial resource strain: Not on file  . Food insecurity:    Worry: Not on file    Inability: Not on file  . Transportation needs:    Medical: Not on file    Non-medical: Not on file  Tobacco Use  . Smoking status: Current Every Day Smoker    Packs/day: 1.00    Years: 3.00    Pack years: 3.00    Types: Cigarettes  . Smokeless tobacco: Never Used  Substance and Sexual Activity  . Alcohol use: No  . Drug use: No  . Sexual activity: Yes     Birth control/protection: None  Lifestyle  . Physical activity:    Days per week: Not on file    Minutes per session: Not on file  . Stress: Not on file  Relationships  . Social connections:    Talks on phone: Not on file    Gets together: Not on file    Attends religious service: Not on file    Active member of club or organization: Not on file    Attends meetings of clubs or organizations: Not on file    Relationship status: Not on file  Other Topics Concern  . Not on file  Social History Narrative   ** Merged History Encounter **        Family History: Family History  Problem Relation Age of Onset  . COPD Mother   . Heart disease Father     Allergies: Allergies  Allergen Reactions  . Benadryl [Diphenhydramine Hcl (Sleep)] Hives and Other (See Comments)    Pt states rxn worsened with diphenhydramine.  . Vancomycin Itching  . Diphenhydramine Rash  .  Peanut-Containing Drug Products Rash    Medications Prior to Admission  Medication Sig Dispense Refill Last Dose  . acetaminophen (TYLENOL) 325 MG tablet Take 2 tablets (650 mg total) by mouth every 6 (six) hours as needed. 60 tablet 1 Taking  . lactulose (CEPHULAC) 10 g packet Take 10 g by mouth 3 (three) times daily.   Not Taking  . ondansetron (ZOFRAN) 4 MG tablet Take 1 tablet (4 mg total) by mouth every 8 (eight) hours as needed for nausea or vomiting. 12 tablet 0 Taking  . Prenatal Multivit-Min-Fe-FA (PRENATAL VITAMINS PO) Take by mouth.   Taking  . promethazine (PHENERGAN) 25 MG tablet Take 25 mg by mouth every 6 (six) hours as needed for nausea or vomiting.   Taking     Review of Systems   All systems reviewed and negative except as stated in HPI  Blood pressure 114/73, pulse 99, temperature 97.9 F (36.6 C), temperature source Oral, resp. rate 16, height 5\' 1"  (1.549 m), weight 62.5 kg (137 lb 11.2 oz), last menstrual period 09/15/2016. General appearance: alert, cooperative, appears stated age and no  distress Lungs: clear to auscultation bilaterally Heart: regular rate and rhythm Abdomen: soft, non-tender; bowel sounds normal Extremities: Homans sign is negative, no sign of DVT DTR's  Presentation: cephalic by cervical exam  Fetal monitoring Uterine activity Dilation: Closed Station: -3 Exam by:: Gifford ShaveYancey Luft RN    Prenatal labs: ABO, Rh: O/Positive/-- (01/09 1139) Antibody: Negative (01/09 1139) Rubella: 4.89 (01/09 1139) RPR: Non Reactive (01/09 1139)  HBsAg: Negative (01/09 1139)  HIV: Non Reactive (01/09 1139)  GBS: Negative (02/20 1516)  1 hr Glucola: Normal Genetic screening: Not performed Anatomy US: Normal  Prenatal Transfer Tool  Maternal Diabetes: No Genetic Screening:  Not performed Maternal Ultrasounds/Referrals: Normal Fetal Ultrasounds or other Referrals:  None  Maternal Substance Abuse:  Yes:  Type: Smoker, Marijuana, History of cocaine use prior to pregnancy  Significant Maternal Medications:  Meds include: Other: Zofran, Reglan Significant Maternal Lab Results: GBS negative   Results for orders placed or performed during the hospital encounter of 06/06/17 (from the past 24 hour(s))  CBC   Collection Time: 06/06/17  8:51 AM  Result Value Ref Range   WBC 18.3 (H) 4.0 - 10.5 K/uL   RBC 4.54 3.87 - 5.11 MIL/uL   Hemoglobin 14.0 12.0 - 15.0 g/dL   HCT 16.140.5 09.636.0 - 04.546.0 %   MCV 89.2 78.0 - 100.0 fL   MCH 30.8 26.0 - 34.0 pg   MCHC 34.6 30.0 - 36.0 g/dL   RDW 40.915.2 81.111.5 - 91.415.5 %   Platelets 217 150 - 400 K/uL    Patient Active Problem List   Diagnosis Date Noted  . Labor and delivery indication for care or intervention 06/06/2017  . Limited prenatal care in third trimester 03/27/2017  . Imprisonment and other incarceration 03/23/2017  . History of drug use 03/23/2017  . Supervision of high risk pregnancy, antepartum 03/20/2017    Assessment/Plan:  [name] is a 22 y.o. G2P0010 at 945w0d here for IOL secondary to post term dates.  #Labor:  Cytotec induction. Possible foley bulb  #Pain: Per maternal request, plans epidural  #FWB:  Category I #ID:  GBS negative  #MOF: Breast and Bottle #MOC: IUD #Circ:  N/A- Girl   Anitra Lautharon A Wooten, Student-PA  06/06/2017, 9:07 AM  I confirm that I have verified the information documented in the physician assistant's note and that I have also personally reperformed the physical  exam and all medical decision making activities.    Sharyon Cable, CNM 06/06/17, 1:25 PM

## 2017-06-06 NOTE — Progress Notes (Addendum)
   [name] is a [age] y.o. G2P0010 at 6461w0d  admitted for induction of labor due to Post dates. .  Subjective:  Comfortable w/epidural.  Had had 4 doses of fentanyl so epidural was best option. Objective: Vitals:   06/06/17 2030 06/06/17 2103 06/06/17 2130 06/06/17 2200  BP: 101/86 (!) 109/58 (!) 108/48 (!) 96/54  Pulse: 89 73 80 (!) 105  Resp:  16  16  Temp:    98.7 F (37.1 C)  TempSrc:    Oral  SpO2:      Weight:      Height:       No intake/output data recorded.  FHT:  FHR: 115 bpm, variability: moderate,  accelerations:  Present,  decelerations:  Absent UC:   2-4 SVE:   Dilation: 5 Effacement (%): 90 Station: -1 Exam by:: Ileana LaddLexie Ament, RN Foley just fell out  Labs: Lab Results  Component Value Date   WBC 18.3 (H) 06/06/2017   HGB 14.0 06/06/2017   HCT 40.5 06/06/2017   MCV 89.2 06/06/2017   PLT 217 06/06/2017    Assessment / Plan: IOL for postdates, ripening phase complete.  Because pt ctx frequetnly and cx is thin, will not start pitocin unless labor stalls  Labor: Progressing normally Fetal Wellbeing:  Category I Pain Control:  Epidural Anticipated MOD:  NSVD  Jacklyn ShellFrances Cresenzo-Dishmon 06/06/2017, 10:59 PM

## 2017-06-06 NOTE — Anesthesia Preprocedure Evaluation (Signed)
Anesthesia Evaluation  Patient identified by MRN, date of birth, ID band Patient awake    Reviewed: Allergy & Precautions, H&P , NPO status , Patient's Chart, lab work & pertinent test results  History of Anesthesia Complications Negative for: history of anesthetic complications  Airway Mallampati: II  TM Distance: >3 FB Neck ROM: full    Dental no notable dental hx. (+) Teeth Intact   Pulmonary asthma , Current Smoker,    Pulmonary exam normal breath sounds clear to auscultation       Cardiovascular negative cardio ROS Normal cardiovascular exam Rhythm:regular Rate:Normal     Neuro/Psych  Headaches, Anxiety    GI/Hepatic negative GI ROS, Neg liver ROS,   Endo/Other  negative endocrine ROS  Renal/GU negative Renal ROS  negative genitourinary   Musculoskeletal   Abdominal   Peds  Hematology negative hematology ROS (+)   Anesthesia Other Findings   Reproductive/Obstetrics (+) Pregnancy                             Anesthesia Physical Anesthesia Plan  ASA: II  Anesthesia Plan: Epidural   Post-op Pain Management:    Induction:   PONV Risk Score and Plan:   Airway Management Planned:   Additional Equipment:   Intra-op Plan:   Post-operative Plan:   Informed Consent: I have reviewed the patients History and Physical, chart, labs and discussed the procedure including the risks, benefits and alternatives for the proposed anesthesia with the patient or authorized representative who has indicated his/her understanding and acceptance.     Plan Discussed with:   Anesthesia Plan Comments:         Anesthesia Quick Evaluation

## 2017-06-07 DIAGNOSIS — O480 Post-term pregnancy: Secondary | ICD-10-CM

## 2017-06-07 DIAGNOSIS — Z3A41 41 weeks gestation of pregnancy: Secondary | ICD-10-CM

## 2017-06-07 DIAGNOSIS — O09299 Supervision of pregnancy with other poor reproductive or obstetric history, unspecified trimester: Secondary | ICD-10-CM

## 2017-06-07 LAB — HEMOGLOBIN AND HEMATOCRIT, BLOOD
HCT: 38.6 % (ref 36.0–46.0)
HEMOGLOBIN: 13.4 g/dL (ref 12.0–15.0)

## 2017-06-07 MED ORDER — SIMETHICONE 80 MG PO CHEW: 80.0000 mg | CHEWABLE_TABLET | ORAL | Status: DC | PRN

## 2017-06-07 MED ORDER — SENNOSIDES-DOCUSATE SODIUM 8.6-50 MG PO TABS
2.0000 | ORAL_TABLET | ORAL | Status: DC
  Administered 2017-06-08: 2 via ORAL
  Filled 2017-06-07 (×2): qty 2

## 2017-06-07 MED ORDER — TERBUTALINE SULFATE 1 MG/ML IJ SOLN: 0.2500 mg | Freq: Once | INTRAMUSCULAR | Status: DC | PRN

## 2017-06-07 MED ORDER — DIPHENHYDRAMINE HCL 25 MG PO CAPS: 25.0000 mg | ORAL_CAPSULE | Freq: Four times a day (QID) | ORAL | Status: DC | PRN

## 2017-06-07 MED ORDER — BENZOCAINE-MENTHOL 20-0.5 % EX AERO
1.0000 "application " | INHALATION_SPRAY | CUTANEOUS | Status: DC | PRN
  Administered 2017-06-08 – 2017-06-09 (×3): 1 via TOPICAL
  Filled 2017-06-07 (×4): qty 56

## 2017-06-07 MED ORDER — COCONUT OIL OIL
1.0000 "application " | TOPICAL_OIL | Status: DC | PRN
  Filled 2017-06-07: qty 120

## 2017-06-07 MED ORDER — DIBUCAINE 1 % RE OINT
1.0000 "application " | TOPICAL_OINTMENT | RECTAL | Status: DC | PRN
  Filled 2017-06-07: qty 28

## 2017-06-07 MED ORDER — PRENATAL MULTIVITAMIN CH
1.0000 | ORAL_TABLET | Freq: Every day | ORAL | Status: DC
  Administered 2017-06-08 – 2017-06-09 (×2): 1 via ORAL
  Filled 2017-06-07 (×2): qty 1

## 2017-06-07 MED ORDER — ONDANSETRON HCL 4 MG PO TABS
4.0000 mg | ORAL_TABLET | ORAL | Status: DC | PRN
Start: 2017-06-07 — End: 2017-06-09

## 2017-06-07 MED ORDER — ACETAMINOPHEN 325 MG PO TABS
650.0000 mg | ORAL_TABLET | ORAL | Status: DC | PRN
  Administered 2017-06-08 – 2017-06-09 (×4): 650 mg via ORAL
  Filled 2017-06-07 (×4): qty 2

## 2017-06-07 MED ORDER — METHYLERGONOVINE MALEATE 0.2 MG/ML IJ SOLN
INTRAMUSCULAR | Status: AC
  Administered 2017-06-07: 0.2 mg
  Filled 2017-06-07: qty 1

## 2017-06-07 MED ORDER — WITCH HAZEL-GLYCERIN EX PADS: 1.0000 "application " | MEDICATED_PAD | CUTANEOUS | Status: DC | PRN

## 2017-06-07 MED ORDER — ONDANSETRON HCL 4 MG/2ML IJ SOLN: 4.0000 mg | INTRAMUSCULAR | Status: DC | PRN

## 2017-06-07 MED ORDER — TETANUS-DIPHTH-ACELL PERTUSSIS 5-2.5-18.5 LF-MCG/0.5 IM SUSP
0.5000 mL | Freq: Once | INTRAMUSCULAR | Status: DC
  Filled 2017-06-07: qty 0.5

## 2017-06-07 MED ORDER — OXYTOCIN 40 UNITS IN LACTATED RINGERS INFUSION - SIMPLE MED
1.0000 m[IU]/min | INTRAVENOUS | Status: DC
  Administered 2017-06-07: 2 m[IU]/min via INTRAVENOUS
  Administered 2017-06-07: 16 m[IU]/min via INTRAVENOUS

## 2017-06-07 MED ORDER — MISOPROSTOL 200 MCG PO TABS
ORAL_TABLET | ORAL | Status: AC
  Filled 2017-06-07: qty 4

## 2017-06-07 MED ORDER — ZOLPIDEM TARTRATE 5 MG PO TABS: 5.0000 mg | ORAL_TABLET | Freq: Every evening | ORAL | Status: DC | PRN

## 2017-06-07 MED ORDER — IBUPROFEN 600 MG PO TABS
600.0000 mg | ORAL_TABLET | Freq: Four times a day (QID) | ORAL | Status: DC
  Administered 2017-06-08 – 2017-06-09 (×7): 600 mg via ORAL
  Filled 2017-06-07 (×7): qty 1

## 2017-06-07 NOTE — Progress Notes (Signed)
Opal SidlesHeather N Parke is a 22 y.o. G2P0010 at 3544w1d admitted for induction of labor due toPost dates    Subjective: Painfully contracting.  Objective: BP (!) 118/101   Pulse 80   Temp (!) 100.9 F (38.3 C) (Oral)   Resp 15   Ht 5\' 1"  (1.549 m)   Wt 62.5 kg (137 lb 11.2 oz)   LMP 09/15/2016 (Approximate)   SpO2 99%   BMI 26.02 kg/m  I/O last 3 completed shifts: In: -  Out: 1150 [Urine:1150] No intake/output data recorded.  FHT:  FHR: 140 bpm, variability: moderate,  accelerations:  Present,  decelerations:  Absent UC:   regular, every 1 - 3 minutes SVE:   Dilation: 9 Effacement (%): 100 Station: Plus 1 Exam by:: Dr Emelda FearFerguson  Labs: Lab Results  Component Value Date   WBC 18.3 (H) 06/06/2017   HGB 14.0 06/06/2017   HCT 40.5 06/06/2017   MCV 89.2 06/06/2017   PLT 217 06/06/2017    Assessment / Plan: Induction of labor due topostterm, progressing well on pitocin. S/p AROM ~12:15 pm. S/p IUPC 1:20 pm.   Labor:Progressing on Pitocin, will continue to increase Fetal Wellbeing:Category I Pain Control:Epidural Anticipated ZOX:WRUEOD:NSVD    Enos FlingKumba A Emannuel Vise 06/07/2017, 6:13 PM

## 2017-06-07 NOTE — Progress Notes (Signed)
Attempts to push unsuccessful.  Repositioned pt to left lateral.  Direct OP presentation.

## 2017-06-07 NOTE — Progress Notes (Signed)
Vitals:   06/07/17 0201 06/07/17 0231  BP: (!) 100/52 (!) 100/53  Pulse: 88 99  Resp: 16   Temp:    SpO2:     Reassessment at 0115 showed cx was 4.5/60/ballottable, ctx spaced out. Ptiocin started.  FHR Cat 1, ctx still irregular.  Pitocin at 4 mu/min. Will need to increase Pitocin until active labor.

## 2017-06-07 NOTE — Anesthesia Postprocedure Evaluation (Signed)
Anesthesia Post Note  Patient: [name]  Procedure(s) Performed: AN AD HOC LABOR EPIDURAL     Patient location during evaluation: Mother Baby Anesthesia Type: Epidural Level of consciousness: awake and alert Pain management: pain level controlled Vital Signs Assessment: post-procedure vital signs reviewed and stable Respiratory status: spontaneous breathing, nonlabored ventilation and respiratory function stable Cardiovascular status: stable Postop Assessment: no headache, no backache and epidural receding Anesthetic complications: no    Last Vitals:  Vitals:   06/07/17 1730 06/07/17 1800  BP: (!) 118/101 (!) 110/57  Pulse: 80 92  Resp: 15 15  Temp:  37.6 C  SpO2:      Last Pain:  Vitals:   06/07/17 1800  TempSrc: Oral  PainSc: 5    Pain Goal:                 Tommaso Cavitt

## 2017-06-07 NOTE — Progress Notes (Signed)
Opal SidlesHeather N Mccasland is a 22 y.o. G2P0010 at 7738w1d admitted for induction of labor due to Post dates.   Subjective: No pain.  Objective: BP 113/70   Pulse 97   Temp 98.6 F (37 C) (Oral)   Resp 15   Ht 5\' 1"  (1.549 m)   Wt 62.5 kg (137 lb 11.2 oz)   LMP 09/15/2016 (Approximate)   SpO2 99%   BMI 26.02 kg/m  I/O last 3 completed shifts: In: -  Out: 1150 [Urine:1150] No intake/output data recorded.  FHT:  FHR: 140 bpm, variability: moderate,  accelerations:  Present,  decelerations:  Absent UC:   irregular, every 2 minutes SVE:   Dilation: 4.5 Effacement (%): 60 Station: Ballotable Exam by:: Resident  Labs: Lab Results  Component Value Date   WBC 18.3 (H) 06/06/2017   HGB 14.0 06/06/2017   HCT 40.5 06/06/2017   MCV 89.2 06/06/2017   PLT 217 06/06/2017    Assessment / Plan: Induction of labor due to postterm,  progressing well on pitocin. S/p AROM ~12:15 pm.  Labor: Progressing on Pitocin, will continue to increase  Fetal Wellbeing:  Category I Pain Control:  Epidural Anticipated MOD:  NSVD  Vester Balthazor A Rithika Seel 06/07/2017, 12:15 PM

## 2017-06-07 NOTE — Progress Notes (Signed)
[name] is a 22 y.o. G2P0010 at 1516w1d admitted for induction of labor due to Post dates.   Subjective: Doing well. Epidural in place and functioning well. Receptive to IUPC.   Objective: BP 117/71   Pulse (!) 116   Temp 99.5 F (37.5 C) (Oral)   Resp 15   Ht 5\' 1"  (1.549 m)   Wt 62.5 kg (137 lb 11.2 oz)   LMP 09/15/2016 (Approximate)   SpO2 99%   BMI 26.02 kg/m  I/O last 3 completed shifts: In: -  Out: 1150 [Urine:1150] No intake/output data recorded.  FHT:  FHR: 160 bpm, variability: moderate,  accelerations:  Present,  decelerations:  Absent UC:   Irregular, difficult to trace SVE:   Dilation: 5 Effacement (%): 70 Station: -2 Exam by:: Resident  Labs: Lab Results  Component Value Date   WBC 18.3 (H) 06/06/2017   HGB 14.0 06/06/2017   HCT 40.5 06/06/2017   MCV 89.2 06/06/2017   PLT 217 06/06/2017    Assessment / Plan: Induction of labor due to postterm,  progressing well on pitocin. S/p AROM ~12:15 pm. S/p IUPC 1:20 pm.   Labor: Progressing on Pitocin, will continue to increase  Fetal Wellbeing:  Category I Pain Control:  Epidural Anticipated MOD:  NSVD    [name] 06/07/2017, 1:22 PM

## 2017-06-08 ENCOUNTER — Encounter (HOSPITAL_COMMUNITY): Payer: Self-pay

## 2017-06-08 LAB — CBC
HCT: 33.6 % — ABNORMAL LOW (ref 36.0–46.0)
HEMOGLOBIN: 11.5 g/dL — AB (ref 12.0–15.0)
MCH: 30.3 pg (ref 26.0–34.0)
MCHC: 34.2 g/dL (ref 30.0–36.0)
MCV: 88.7 fL (ref 78.0–100.0)
PLATELETS: 183 10*3/uL (ref 150–400)
RBC: 3.79 MIL/uL — ABNORMAL LOW (ref 3.87–5.11)
RDW: 15.1 % (ref 11.5–15.5)
WBC: 20.8 10*3/uL — ABNORMAL HIGH (ref 4.0–10.5)

## 2017-06-08 NOTE — Anesthesia Postprocedure Evaluation (Signed)
Anesthesia Post Note  Patient: [name]  Procedure(s) Performed: AN AD HOC LABOR EPIDURAL     Patient location during evaluation: Mother Baby Anesthesia Type: Epidural Level of consciousness: awake and alert Pain management: pain level controlled Vital Signs Assessment: post-procedure vital signs reviewed and stable Respiratory status: spontaneous breathing, nonlabored ventilation and respiratory function stable Cardiovascular status: stable Postop Assessment: no headache, no backache and epidural receding Anesthetic complications: no    Last Vitals:  Vitals:   06/08/17 0035 06/08/17 0500  BP: 119/81 (!) 106/59  Pulse: 92 71  Resp: 18 18  Temp: 37.3 C 36.5 C  SpO2: 99%     Last Pain:  Vitals:   06/08/17 0518  TempSrc:   PainSc: 8    Pain Goal: Patients Stated Pain Goal: 3 (06/08/17 0035)               Trellis PaganiniBREWER,Ying Blankenhorn N

## 2017-06-08 NOTE — Addendum Note (Signed)
Addendum  created 06/08/17 0732 by Trellis PaganiniBrewer, Chrystina Naff N, CRNA   Charge Capture section accepted, Sign clinical note, Visit diagnoses modified

## 2017-06-08 NOTE — Progress Notes (Signed)
Post Partum Day 1 Subjective: no complaints, up ad lib, voiding and tolerating PO  Objective: Blood pressure (!) 106/59, pulse 71, temperature 97.7 F (36.5 C), temperature source Oral, resp. rate 18, height 5\' 1"  (1.549 m), weight 137 lb 11.2 oz (62.5 kg), last menstrual period 09/15/2016, SpO2 99 %, currently not breastfeeding d/t "difficulty latching"; desires to start pumping.  Physical Exam:  General: alert, cooperative and no distress Lochia: appropriate Uterine Fundus: firm Incision: N/A DVT Evaluation: No evidence of DVT seen on physical exam. Negative Homan's sign. No cords or calf tenderness. No significant calf/ankle edema.  Recent Labs    06/07/17 2321 06/08/17 0530  HGB 13.4 11.5*  HCT 38.6 33.6*    Assessment/Plan: Plan for discharge tomorrow, Lactation consult, Social Work consult and Contraception IUD   LOS: 2 days   Raelyn Moraolitta Branae Crail, MSN, CNM 06/08/2017, 10:28 AM

## 2017-06-08 NOTE — Progress Notes (Signed)
CSW attempted to meet with patient and newborn, per environmental tech the two were "upstairs." CSW will revisit patient's room in a few hours.  Key Cen, MSW, LCSW-A Clinical Social Worker [facility] [phone]

## 2017-06-08 NOTE — Progress Notes (Signed)
CSW met with patient, infant, and father of baby at bedside. CSW received permission from patient to discuss reasons for consult in front of FOB. Patient's infant's name is Daleney Mattox and father of baby is Bryan Mattox. CSW informed patient that both her and the baby tested positive for marijuana upon admission and delivery. Patient stated that she ate a "weed gummy" one day prior to coming to [facility]. Patient stated that her brother had left these gummy's on the counter and she thought they were her prenatal vitamins. Patient stated that she informed her OBGYN, and the OBGYN told her everything would be okay. CSW and patient discussed drug screening policy, cord tissue testing, and mandatory notification to DSS if substances were found that could not be explained. Patient and father of baby stated understanding. Patient stated that she has a bassinet and crib for infant, CSW emphasized importance of reducing SIDS and how to properly put the infant down for sleeping. Patient stated that she is currently living with her step dad at [address], [place], [place]. Patient's infant will receive pediatric care at [facility]. Patient receives WIC, Food Stamps, and Medicaid. CSW encouraged parents to reach out for assistance if needs arise before or after delivery.  Quinn Bartling, MSW, LCSW-A Clinical Social Worker [facility] [phone]   

## 2017-06-08 NOTE — Progress Notes (Signed)
Mother refused the Nicotine patch stating that she wants to smoke. She only used the patch when she was in labor and unable to get out of bed.

## 2017-06-09 LAB — CBC
HCT: 29.3 % — ABNORMAL LOW (ref 36.0–46.0)
Hemoglobin: 10 g/dL — ABNORMAL LOW (ref 12.0–15.0)
MCH: 30.8 pg (ref 26.0–34.0)
MCHC: 34.1 g/dL (ref 30.0–36.0)
MCV: 90.2 fL (ref 78.0–100.0)
PLATELETS: 191 10*3/uL (ref 150–400)
RBC: 3.25 MIL/uL — ABNORMAL LOW (ref 3.87–5.11)
RDW: 15.4 % (ref 11.5–15.5)
WBC: 15.4 10*3/uL — AB (ref 4.0–10.5)

## 2017-06-09 MED ORDER — IBUPROFEN 600 MG PO TABS: 600.0000 mg | ORAL_TABLET | Freq: Four times a day (QID) | ORAL | 0 refills | Status: DC

## 2017-06-09 NOTE — Progress Notes (Signed)
CSW completed and submitted CC4C Referral form to [facility].  Charlise Giovanetti, MSW, LCSW-A Clinical Social Worker [facility] [phone]   

## 2017-06-09 NOTE — Progress Notes (Signed)
BP 74/40, asymptomatic.  Notified Scioarolina Neill, [place].  No new orders at this time.

## 2017-06-09 NOTE — Discharge Summary (Addendum)
OB Discharge Summary     Patient Name: [name] DOB: 10-22-1995 MRN: [id number]  Date of admission: 06/06/2017 Delivering MD: Ellwood DenseUMBALL, ALISON   Date of discharge: 06/09/2017  Admitting diagnosis: INDUCTION Intrauterine pregnancy: 5720w1d     Secondary diagnosis:  Principal Problem:   SVD (spontaneous vaginal delivery) Active Problems:   History of drug use   Limited prenatal care in third trimester   Labor and delivery indication for care or intervention   Marijuana use   Postpartum hemorrhage  Additional problems: History of incarceration  Use of tobacco during pregnancy Use of marijuana during pregnancy (UDS positive on baby)     Discharge diagnosis: Term Pregnancy Delivered                                                                                                Post partum procedures:None  Augmentation: AROM, Pitocin, Cytotec and Foley Balloon  Complications: Hemorrhage>101200mL   Hospital course:  Induction of Labor With Vaginal Delivery   22 y.o. yo G2P1011 at 6520w1d was admitted to the hospital 06/06/2017 for induction of labor.  Indication for induction: Postdates.  Patient had an uncomplicated labor course as follows: Membrane Rupture Time/Date: 12:10 PM ,06/07/2017   Intrapartum Procedures: Episiotomy: None [1]                                         Lacerations:  Vaginal [6];Perineal [11];2nd degree [3]  Patient had delivery of a Viable infant.  Information for the patient's newborn:  [name], [name] [[id number]][[id number]]  Delivery Method: Vag-Spont   06/07/2017  Details of delivery can be found in separate delivery note.  Patient had a postpartum hemorrhage of ~253600mL and was given oral Cytotec 800mcg and IM methergine. Fundal massage resulted in good tone. Patient is discharged home 06/09/17.  Physical exam  Vitals:   06/08/17 0035 06/08/17 0500 06/08/17 0915 06/08/17 1800  BP: 119/81 (!) 106/59 107/61 (!) 94/52  Pulse: 92 71 72 70  Resp: 18 18 18 18    Temp: 99.2 F (37.3 C) 97.7 F (36.5 C) (!) 97.5 F (36.4 C) 98.1 F (36.7 C)  TempSrc: Oral Oral Oral Oral  SpO2: 99%     Weight:      Height:       General: alert, cooperative and no distress Lochia: appropriate Uterine Fundus: firm Incision: N/A DVT Evaluation: No evidence of DVT seen on physical exam. Labs: Lab Results  Component Value Date   WBC 20.8 (H) 06/08/2017   HGB 11.5 (L) 06/08/2017   HCT 33.6 (L) 06/08/2017   MCV 88.7 06/08/2017   PLT 183 06/08/2017   CMP Latest Ref Rng & Units 05/10/2016  Glucose 65 - 99 mg/dL 94  BUN 6 - 20 mg/dL 11  Creatinine 7.820.44 - 9.561.00 mg/dL 2.130.53  Sodium 086135 - 578145 mmol/L 137  Potassium 3.5 - 5.1 mmol/L 3.6  Chloride 101 - 111 mmol/L 107  CO2 22 - 32 mmol/L 24  Calcium 8.9 - 10.3 mg/dL 4.6(N8.6(L)  Total Protein 6.5 - 8.1 g/dL -  Total Bilirubin 0.3 - 1.2 mg/dL -  Alkaline Phos 38 - 161 U/L -  AST 15 - 41 U/L -  ALT 14 - 54 U/L -    Discharge instruction: per After Visit Summary and "Baby and Me Booklet".  After visit meds:  Allergies as of 06/09/2017      Reactions   Benadryl [diphenhydramine Hcl (sleep)] Hives, Other (See Comments)   Pt states rxn worsened with diphenhydramine.   Penicillins Other (See Comments)   Has patient had a PCN reaction causing immediate rash, facial/tongue/throat swelling, SOB or lightheadedness with hypotension: Unknown Has patient had a PCN reaction causing severe rash involving mucus membranes or skin necrosis: Unknown Has patient had a PCN reaction that required hospitalization: Unknown Has patient had a PCN reaction occurring within the last 10 years: Unknown If all of the above answers are "NO", then may proceed with Cephalosporin use.   Diphenhydramine Rash   Peanut-containing Drug Products Rash      Medication List    STOP taking these medications   ondansetron 4 MG tablet Commonly known as:  ZOFRAN   promethazine 25 MG tablet Commonly known as:  PHENERGAN     TAKE these medications    acetaminophen 325 MG tablet Commonly known as:  TYLENOL Take 2 tablets (650 mg total) by mouth every 6 (six) hours as needed.   ibuprofen 600 MG tablet Commonly known as:  ADVIL,MOTRIN Take 1 tablet (600 mg total) by mouth every 6 (six) hours.   MELATIN PO Take 1 tablet by mouth at bedtime as needed (for sleep).   PRENATAL VITAMINS PO Take 1 tablet by mouth at bedtime.       Diet: routine diet  Activity: Advance as tolerated. Pelvic rest for 6 weeks.   Outpatient follow up:4 weeks Follow up Appt:No future appointments. Follow up Visit:No follow-ups on file.  Postpartum contraception: IUD  Newborn Data: Live born female  Birth Weight: 7 lb 11.8 oz (3510 g) APGAR: 8, 9  Newborn Delivery   Birth date/time:  06/07/2017 20:33:00 Delivery type:  Vaginal, Spontaneous     Baby Feeding: Bottle and Breast Disposition:home with mother   06/09/2017 Felicie Morn, Medical Student  I confirm that I have verified the information documented in the medical student's note and that I have also personally reperformed the physical exam and all medical decision making activities.  Rolm Bookbinder, CNM 06/09/2017 09:00AM

## 2017-06-24 ENCOUNTER — Telehealth: Payer: Self-pay | Admitting: Obstetrics & Gynecology

## 2017-06-24 ENCOUNTER — Telehealth: Payer: Self-pay | Admitting: General Practice

## 2017-06-24 NOTE — BH Specialist Note (Deleted)
Integrated Behavioral Health Initial Visit  MRN: [id number] Name: [name]  Number of Integrated Behavioral Health Clinician visits:: 1/6 Session Start time: ***  Session End time: *** Total time: {IBH Total Time:21014050}  Type of Service: Integrated Behavioral Health- Individual/Family Interpretor:No. Interpretor Name and Language: n/a   Warm Hand Off Completed.       SUBJECTIVE: [name] is a 22 y.o. female accompanied by {CHL AMB ACCOMPANIED [phone]} Patient was referred by Dr Macon LargeAnyanwu for depression postpartum, [facility] Patient reports the following symptoms/concerns: *** Duration of problem: ***; Severity of problem: {Mild/Moderate/Severe:20260}  OBJECTIVE: Mood: {BHH MOOD:22306} and Affect: {BHH AFFECT:22307} Risk of harm to self or others: {CHL AMB BH Suicide Current Mental Status:21022748}  LIFE CONTEXT: Family and Social: *** School/Work: *** Self-Care: *** Life Changes: ***  GOALS ADDRESSED: Patient will: 1. Reduce symptoms of: {IBH Symptoms:21014056} 2. Increase knowledge and/or ability of: {IBH Patient Tools:21014057}  3. Demonstrate ability to: {IBH Goals:21014053}  INTERVENTIONS: Interventions utilized: {IBH Interventions:21014054}  Standardized Assessments completed: {IBH Screening Tools:21014051}  ASSESSMENT: Patient currently experiencing ***.   Patient may benefit from ***.  PLAN: 1. Follow up with behavioral health clinician on : *** 2. Behavioral recommendations: *** 3. Referral(s): {IBH Referrals:21014055} 4. "From scale of 1-10, how likely are you to follow plan?": ***  Rae LipsJamie C Bacilio Abascal, LCSW     Depression screen [facility] 05/09/2017  Decreased Interest 0  Down, Depressed, Hopeless 0  PHQ - 2 Score 0  Altered sleeping 3  Tired, decreased energy 3  Change in appetite 1  Trouble concentrating 0  Moving slowly or fidgety/restless 0  Suicidal thoughts 0  PHQ-9 Score 7   GAD 7 :  Generalized Anxiety Score 05/09/2017  Nervous, Anxious, on Edge 0  Control/stop worrying 0  Worry too much - different things 0  Trouble relaxing 0  Restless 0  Easily annoyed or irritable 0  Afraid - awful might happen 0  Total GAD 7 Score 0

## 2017-06-24 NOTE — Telephone Encounter (Signed)
Called and notified patient of Postpartum appointment that is scheduled for 07/18/17 @ 0955.  Patient voiced understanding.

## 2017-06-24 NOTE — Telephone Encounter (Signed)
Faculty Practice OB/GYN Physician Phone Call Documentation  I received a call from a [facility] nurse regarding Victoria SidlesHeather N Mcmillon, with report of postpartum depression with a score of 13. Patient reported crying all the time, feeling overwhelmed, wants to talk to someone.  Denied any SI/HI. Offered consultation with our [facility] Counsel[facility]; she wants to see her tomorrow; appointment made at 9 am.  Patient agreed to this plan. [facility] and management for any worsening symptoms in the meantime.     Jaynie CollinsUGONNA  Arlyce Circle, MD, FACOG Obstetrician & Gynecologist, [facility], [facility]

## 2017-06-25 ENCOUNTER — Institutional Professional Consult (permissible substitution): Payer: Self-pay

## 2017-07-02 ENCOUNTER — Ambulatory Visit (INDEPENDENT_AMBULATORY_CARE_PROVIDER_SITE_OTHER): Payer: Medicaid Other | Admitting: Clinical

## 2017-07-02 DIAGNOSIS — F411 Generalized anxiety disorder: Secondary | ICD-10-CM | POA: Diagnosis not present

## 2017-07-02 NOTE — BH Specialist Note (Signed)
Integrated Behavioral Health Initial Visit  MRN: [id number] Name: Victoria SidlesHeather N Kreeger  Number of Integrated Behavioral Health Clinician visits:: 1/6 Session Start time: 1:50  Session End time: 3:00 Total time: 1 hour  Type of Service: Integrated Behavioral Health- Individual/Family Interpretor:No. Interpretor Name and Language: n/a   Warm Hand Off Completed.       SUBJECTIVE: [name] is a 22 y.o. female accompanied by infant daughter Patient was referred by Dr Macon LargeAnyanwu for depression postpartum Patient reports the following symptoms/concerns: Pt says she has been having at least one panic attack daily, stopped taking Xanax for anxiety during pregnancy, and is open to learning additional self-coping strategies today.  Duration of problem: Increase in pregnancy and postpartum; Severity of problem: moderate  OBJECTIVE: Mood: Anxious and Affect: Appropriate Risk of harm to self or others: No plan to harm self or others  LIFE CONTEXT: Family and Social: Pt lives in a household of at least 11 people, including her infant daughter, FOB, nieces and nephews (1,3,5,9,14), etc. School/Work: Home with baby Self-Care: Goes into a quiet space to smoke  Life Changes: Recent childbirth  GOALS ADDRESSED: Patient will: 1. Reduce symptoms of: anxiety and stress 2. Increase knowledge and/or ability of: self-management skills and stress reduction  3. Demonstrate ability to: Increase healthy adjustment to current life circumstances and Increase adequate support systems for patient/family  INTERVENTIONS: Interventions utilized: Mindfulness or Management consultantelaxation Training, Functional Assessment of ADLs, Psychoeducation and/or Health Education and Link to [facility] Resources  Standardized Assessments completed: GAD-7 and PHQ 9  ASSESSMENT: Patient currently experiencing Generalized anxiety disorder   Patient may benefit from psychoeducation and brief therapeutic interventions regarding coping with  symptoms of anxiety with panic attacks .and depression  PLAN: 1. Follow up with behavioral health clinician on : Two weeks 2. Behavioral recommendations:  -[facility] for ongoing therapy and BH med management -CALM relaxation breathing exercise as needed throughout the day -Consider apps for additional self-care -Read educational materials regarding coping with symptoms of anxiety with panic attacks and depression  3. Referral(s): Integrated Art gallery managerBehavioral Health Services (In Clinic), Community Mental Health Services (LME/Outside Clinic) and Community Resources:  MOM Talk 4. "From scale of 1-10, how likely are you to follow plan?": 8  Porcia Morganti C Kadasia Kassing, LCSW

## 2017-07-18 ENCOUNTER — Encounter: Payer: Self-pay | Admitting: Medical

## 2017-07-18 ENCOUNTER — Ambulatory Visit: Payer: Self-pay | Admitting: Medical

## 2017-09-07 ENCOUNTER — Encounter (HOSPITAL_COMMUNITY): Payer: Self-pay | Admitting: Emergency Medicine

## 2017-09-07 ENCOUNTER — Emergency Department (HOSPITAL_COMMUNITY)
Admission: EM | Admit: 2017-09-07 | Discharge: 2017-09-07 | Disposition: A | Discharge status: Home / Self Care | Payer: Medicaid Other | Attending: Emergency Medicine | Admitting: Emergency Medicine

## 2017-09-07 ENCOUNTER — Other Ambulatory Visit: Payer: Self-pay

## 2017-09-07 DIAGNOSIS — Z91010 Allergy to peanuts: Secondary | ICD-10-CM | POA: Insufficient documentation

## 2017-09-07 DIAGNOSIS — R21 Rash and other nonspecific skin eruption: Secondary | ICD-10-CM | POA: Insufficient documentation

## 2017-09-07 DIAGNOSIS — J45909 Unspecified asthma, uncomplicated: Secondary | ICD-10-CM | POA: Diagnosis not present

## 2017-09-07 DIAGNOSIS — F17210 Nicotine dependence, cigarettes, uncomplicated: Secondary | ICD-10-CM | POA: Diagnosis not present

## 2017-09-07 MED ORDER — PREDNISONE 10 MG PO TABS: 40.0000 mg | ORAL_TABLET | Freq: Every day | ORAL | 0 refills | Status: DC

## 2017-09-07 MED ORDER — FAMOTIDINE 20 MG PO TABS: 20.0000 mg | ORAL_TABLET | Freq: Two times a day (BID) | ORAL | 0 refills | Status: DC

## 2017-09-07 NOTE — ED Notes (Signed)
Patient Alert and oriented to baseline. Stable and ambulatory to baseline. Patient verbalized understanding of the discharge instructions.  Patient belongings were taken by the patient.   

## 2017-09-07 NOTE — ED Notes (Signed)
Patient is A&Ox4 at this time.  Patient in no signs of distress.  Please see providers note for complete history and physical exam.  

## 2017-09-07 NOTE — ED Triage Notes (Signed)
Pt reports that she went to her sisters house yesterday and woke up with bites all over. Bites are red and raised and are on her arms, legs, and back. VSS. Pt reports that she has been taking benadryl with no relief of the itching. Unsure what has bitten her.

## 2017-09-07 NOTE — Discharge Instructions (Signed)
You were seen in the emergency department for a rash.  It is possible that this is due to bedbugs, however we cannot be completely sure.  Continue to take Benadryl per over-the-counter dosing for continued itching.  We have additionally given you prescription for prednisone (steroid) as well as Pepcid (antihistamine) to help with itching and with the rash.  We have prescribed you new medication(s) today. Discuss the medications prescribed today with your pharmacist as they can have adverse effects and interactions with your other medicines including over the counter and prescribed medications. Seek medical evaluation if you start to experience new or abnormal symptoms after taking one of these medicines, seek care immediately if you start to experience difficulty breathing, feeling of your throat closing, facial swelling, or rash as these could be indications of a more serious allergic reaction  Clean all clothing and services with hot water.  Follow-up with your primary care provider within 3 days for reevaluation.  Return to the ER anytime for new or worsening symptoms including but not limited to spreading rash, fever, trouble breathing, feeling like your airway is closing, or any other concerns  Additionally your blood pressure was elevated in the emergency department today, please have this rechecked by her primary care provider within 1 week.

## 2017-09-07 NOTE — ED Provider Notes (Signed)
[facility] EMERGENCY [facility]PARTMENT Provider Note   CSN: [id number] Arrival date & time: 09/07/17  2148     History   Chief Complaint Chief Complaint  Patient presents with  . Insect Bite    HPI [name] is a 22 y.o. female with a hx of tobacco abuse, anxiety, and asthma who presents to the ED with complaints of possible bug bites which she first noted this AM. Patient states she went to bed last night asymptomatic, she slept on her sister's couch where she has not slept previously, when she woke up she had multiple erythematous pruritic areas to her extremities x 4, trunk, and upper portion of her buttocks. No genital involvement. Did not witness of feel bites. She states that some of the areas have started to improve throughout the day. She has been utilizing benadryl and topical alcohol with some improvement in itching. No other specific alleviating/aggaravating factors. Areas are not painful. No one else w/ similar symptoms. No other new exposures. Denies dyspnea, feeling of throat closing, or trouble swallowing.   HPI  Past Medical History:  Diagnosis Date  . Anxiety   . Asthma   . Headache     Patient Active Problem List   Diagnosis Date Noted  . SVD (spontaneous vaginal delivery) 06/07/2017  . Postpartum hemorrhage 06/07/2017  . Labor and delivery indication for care or intervention 06/06/2017  . Marijuana use 06/06/2017  . Limited prenatal care in third trimester 03/27/2017  . Imprisonment and other incarceration 03/23/2017  . History of drug use 03/23/2017  . Supervision of high risk pregnancy, antepartum 03/20/2017    Past Surgical History:  Procedure Laterality Date  . FRACTURE SURGERY    . ORTHOPEDIC SURGERY       OB History    Gravida  2   Para  1   Term  1   Preterm  0   AB  1   Living  1     SAB  1   TAB  0   Ectopic  0   Multiple  0   Live Births  1            Home Medications    Prior to Admission  medications   Medication Sig Start Date End Date Taking? Authorizing Provider  diphenhydrAMINE (BENADRYL) 25 mg capsule Take 50 mg by mouth every 6 (six) hours as needed for itching or allergies.   Yes [provider]    Family History Family History  Problem Relation Age of Onset  . COPD Mother   . Heart disease Father     Social History Social History   Tobacco Use  . Smoking status: Current Every Day Smoker    Packs/day: 1.00    Years: 3.00    Pack years: 3.00    Types: Cigarettes  . Smokeless tobacco: Never Used  Substance Use Topics  . Alcohol use: No  . Drug use: No     Allergies   Sulfamethoxazole-trimethoprim; Penicillins; and Peanut-containing drug products   Review of Systems Review of Systems  HENT: Negative for sore throat and trouble swallowing.   Respiratory: Negative for shortness of breath.   Skin: Positive for rash.     Physical Exam Updated Vital Signs BP (!) 145/99 (BP Location: Right Arm)   Pulse (!) 108   Temp 98.6 F (37 C) (Oral)   Resp 16   Ht 5\' 1"  (1.549 m)   Wt 49.9 kg (110 lb)  SpO2 98%   BMI 20.78 kg/m   Physical Exam  Constitutional: She appears well-developed and well-nourished.  Non-toxic appearance.  HENT:  Head: Normocephalic and atraumatic.  Mouth/Throat: Uvula is midline and oropharynx is clear and moist.  Patient is tolerating her own secretions without difficulty.  No trismus.  No drooling.  Airway is patent.  Eyes: Conjunctivae are normal. Right eye exhibits no discharge. Left eye exhibits no discharge.  Cardiovascular: Normal rate and regular rhythm.  Pulmonary/Chest: Effort normal and breath sounds normal. No stridor. No respiratory distress. She has no wheezes.  Neurological: She is alert.  Clear speech.   Skin:  Patient has several erythematous papules of varying size to her extremities x 4, abdomen, back, and buttocks area. No involvement of mucous membranes, palms, soles, or inter-web spaces.  Not  warm to touch.  No vesicles.  No pustules.  No urticaria.  No appreciable scaling or burrowed appearance.  Psychiatric: She has a normal mood and affect. Her behavior is normal. Thought content normal.  Nursing note and vitals reviewed.    ED Treatments / Results  Labs (all labs ordered are listed, but only abnormal results are displayed) Labs Reviewed - No data to display  EKG None  Radiology No results found.  Procedures Procedures (including critical care time)  Medications Ordered in ED Medications - No data to display   Initial Impression / Assessment and Plan / ED Course  I have reviewed the triage vital signs and the nursing notes.  Pertinent labs & imaging results that were available during my care of the patient were reviewed by me and considered in my medical decision making (see chart for details).   Patient presents with rash. Patient nontoxic appearing, vitals notable for elevated BP/HR initially, normalized on repeat vitals and on my exam. Rahs appears to be multiple erythematous pruritic papules consistent with likely bug bites, no mucous membrane or palm/sole involvement. Patient denies any difficulty breathing or swallowing.  Pt has a patent airway without stridor and is handling secretions without difficulty; no angioedema. Rash does not appear consistent with scabies. Given widespread involvement and patient discomfort feel that short course steroids with continued benadryl and with pepcid is reasonable. I discussed treatment plan, need for PCP follow-up, and return precautions with the patient. Provided opportunity for questions, patient confirmed understanding and is in agreement with plan.   Vitals:   09/07/17 2155 09/07/17 2251  BP: (!) 145/99 138/88  Pulse: (!) 108 92  Resp: 16 16  Temp: 98.6 F (37 C)   SpO2: 98%    Final Clinical Impressions(s) / ED Diagnoses   Final diagnoses:  Rash    ED Discharge Orders        Ordered    famotidine (PEPCID)  20 MG tablet  2 times daily     09/07/17 2246    predniSONE (DELTASONE) 10 MG tablet  Daily     09/07/17 2246       Debbie Yearick, Pleas Koch, PA-C 09/07/17 2339    Loren Racer, MD 09/10/17 1650

## 2018-09-05 DIAGNOSIS — Z3009 Encounter for other general counseling and advice on contraception: Secondary | ICD-10-CM | POA: Diagnosis not present

## 2018-09-05 DIAGNOSIS — Z1388 Encounter for screening for disorder due to exposure to contaminants: Secondary | ICD-10-CM | POA: Diagnosis not present

## 2018-09-05 DIAGNOSIS — Z0389 Encounter for observation for other suspected diseases and conditions ruled out: Secondary | ICD-10-CM | POA: Diagnosis not present

## 2022-07-04 ENCOUNTER — Ambulatory Visit
Admission: EM | Admit: 2022-07-04 | Discharge: 2022-07-04 | Disposition: A | Discharge status: Home / Self Care | Payer: Medicaid Other | Attending: Internal Medicine | Admitting: Internal Medicine

## 2022-07-04 DIAGNOSIS — J020 Streptococcal pharyngitis: Secondary | ICD-10-CM

## 2022-07-04 LAB — POCT INFLUENZA A/B
Influenza A, POC: NEGATIVE
Influenza B, POC: NEGATIVE

## 2022-07-04 LAB — POCT RAPID STREP A (OFFICE): Rapid Strep A Screen: POSITIVE — AB

## 2022-07-04 MED ORDER — AZITHROMYCIN 250 MG PO TABS: ORAL_TABLET | ORAL | 0 refills | Status: AC

## 2022-07-04 NOTE — Discharge Instructions (Addendum)
You have strep throat which is being treated with an antibiotic.  Change toothbrush on day 3-4 of antibiotic to prevent reinfection.  Rapid flu is negative.   Follow-up if any symptoms persist or worsen.

## 2022-07-04 NOTE — ED Provider Notes (Signed)
[facility]    CSN: [id number] Arrival date & time: 07/04/22  1312      History   Chief Complaint Chief Complaint  Patient presents with   Sore Throat    HPI [name] is a 27 y.o. female.   Patient presents with sore throat, chills, body aches that started this morning.  Patient has taken Tylenol for symptoms.  Reports her child has a runny nose but no other similar symptoms.  Patient not reporting chest pain, shortness of breath, gastrointestinal symptoms.   Sore Throat    Past Medical History:  Diagnosis Date   Anxiety    Asthma    Headache     Patient Active Problem List   Diagnosis Date Noted   SVD (spontaneous vaginal delivery) 06/07/2017   Postpartum hemorrhage 06/07/2017   Labor and delivery indication for care or intervention 06/06/2017   Marijuana use 06/06/2017   Limited prenatal care in third trimester 03/27/2017   Imprisonment and other incarceration 03/23/2017   History of drug use 03/23/2017   Supervision of high risk pregnancy, antepartum 03/20/2017    Past Surgical History:  Procedure Laterality Date   FRACTURE SURGERY     ORTHOPEDIC SURGERY      OB History     Gravida  2   Para  1   Term  1   Preterm  0   AB  1   Living  1      SAB  1   IAB  0   Ectopic  0   Multiple  0   Live Births  1            Home Medications    Prior to Admission medications   Medication Sig Start Date End Date Taking? Authorizing Provider  azithromycin (ZITHROMAX Z-PAK) 250 MG tablet Take 2 tablets (500 mg total) by mouth daily for 1 day, THEN 1 tablet (250 mg total) daily for 4 days. 07/04/22 07/09/22 Yes Alli Jasmer, Acie Fredrickson, FNP  diphenhydrAMINE (BENADRYL) 25 mg capsule Take 50 mg by mouth every 6 (six) hours as needed for itching or allergies.    [provider]  famotidine (PEPCID) 20 MG tablet Take 1 tablet (20 mg total) by mouth 2 (two) times daily. 09/07/17   Petrucelli, Samantha R, PA-C  predniSONE  (DELTASONE) 10 MG tablet Take 4 tablets (40 mg total) by mouth daily. 09/07/17   Petrucelli, Pleas Koch, PA-C    Family History Family History  Problem Relation Age of Onset   COPD Mother    Heart disease Father     Social History Social History   Tobacco Use   Smoking status: Every Day    Packs/day: 1.00    Years: 3.00    Additional pack years: 0.00    Total pack years: 3.00    Types: Cigarettes   Smokeless tobacco: Never  Substance Use Topics   Alcohol use: No   Drug use: No     Allergies   Sulfamethoxazole-trimethoprim, Penicillins, and Peanut-containing drug products   Review of Systems Review of Systems Per HPI  Physical Exam Triage Vital Signs ED Triage Vitals  Enc Vitals Group     BP 07/04/22 1327 107/72     Pulse Rate 07/04/22 1327 92     Resp 07/04/22 1327 16     Temp 07/04/22 1327 99.7 F (37.6 C)     Temp Source 07/04/22 1327 Oral     SpO2 07/04/22 1327 97 %  Weight 07/04/22 1327 107 lb (48.5 kg)     Height 07/04/22 1327  (1.549 m)     Head Circumference --      Peak Flow --      Pain Score 07/04/22 1326 7     Pain Loc --      Pain Edu? --      Excl. in GC? --    No data found.  Updated Vital Signs BP 107/72 (BP Location: Right Arm)   Pulse 92   Temp 99.7 F (37.6 C) (Oral)   Resp 16   Ht  (1.549 m)   Wt 107 lb (48.5 kg)   LMP 07/04/2022 (Exact Date)   SpO2 97%   Breastfeeding No   BMI 20.22 kg/m   Visual Acuity Right Eye Distance:   Left Eye Distance:   Bilateral Distance:    Right Eye Near:   Left Eye Near:    Bilateral Near:     Physical Exam Constitutional:      General: She is not in acute distress.    Appearance: Normal appearance. She is not toxic-appearing or diaphoretic.  HENT:     Head: Normocephalic and atraumatic.     Right Ear: Tympanic membrane and ear canal normal.     Left Ear: Tympanic membrane and ear canal normal.     Nose: Congestion present.     Mouth/Throat:     Mouth: Mucous  membranes are moist.     Pharynx: Posterior oropharyngeal erythema present. No oropharyngeal exudate.     Tonsils: Tonsillar exudate present. No tonsillar abscesses. 1+ on the right. 1+ on the left.  Eyes:     Extraocular Movements: Extraocular movements intact.     Conjunctiva/sclera: Conjunctivae normal.     Pupils: Pupils are equal, round, and reactive to light.  Cardiovascular:     Rate and Rhythm: Normal rate and regular rhythm.     Pulses: Normal pulses.     Heart sounds: Normal heart sounds.  Pulmonary:     Effort: Pulmonary effort is normal. No respiratory distress.     Breath sounds: Normal breath sounds. No wheezing.  Abdominal:     General: Abdomen is flat. Bowel sounds are normal.     Palpations: Abdomen is soft.  Musculoskeletal:        General: Normal range of motion.     Cervical back: Normal range of motion.  Skin:    General: Skin is warm and dry.  Neurological:     General: No focal deficit present.     Mental Status: She is alert and oriented to person, place, and time. Mental status is at baseline.  Psychiatric:        Mood and Affect: Mood normal.        Behavior: Behavior normal.      UC Treatments / Results  Labs (all labs ordered are listed, but only abnormal results are displayed) Labs Reviewed  POCT RAPID STREP A (OFFICE) - Abnormal; Notable for the following components:      Result Value   Rapid Strep A Screen Positive (*)    All other components within normal limits  POCT INFLUENZA A/B    EKG   Radiology No results found.  Procedures Procedures (including critical care time)  Medications Ordered in UC Medications - No data to display  Initial Impression / Assessment and Plan / UC Course  I have reviewed the triage vital signs and the nursing notes.  Pertinent labs &  imaging results that were available during my care of the patient were reviewed by me and considered in my medical decision making (see chart for details).      Strep was positive.  Will treat with azithromycin given penicillin allergy.  No signs of peritonsillar abscess on exam.  Rapid flu is negative.  Patient declined covid testing.  Advised supportive care and symptom management.  Advised strict return precautions.  Patient verbalized understanding and was agreeable with plan. Final Clinical Impressions(s) / UC Diagnoses   Final diagnoses:  Strep pharyngitis     Discharge Instructions      You have strep throat which is being treated with an antibiotic.  Change toothbrush on day 3-4 of antibiotic to prevent reinfection.  Rapid flu is negative.   Follow-up if any symptoms persist or worsen.     ED Prescriptions     Medication Sig Dispense Auth. Provider   azithromycin (ZITHROMAX Z-PAK) 250 MG tablet Take 2 tablets (500 mg total) by mouth daily for 1 day, THEN 1 tablet (250 mg total) daily for 4 days. 6 tablet Chauvin, Acie Fredrickson, [place]      PDMP not reviewed this encounter.   Gustavus Bryant, [place] 07/04/22 1431

## 2022-07-04 NOTE — ED Triage Notes (Signed)
Patient here today with ST, chills, body aches, sweats this morning. She took Tylenol this morning at 8:00 am with some relief.

## 2022-07-14 ENCOUNTER — Ambulatory Visit
Admission: RE | Admit: 2022-07-14 | Discharge: 2022-07-14 | Disposition: A | Discharge status: Home / Self Care | Payer: Self-pay | Source: Ambulatory Visit

## 2022-07-14 VITALS — BP 107/71 | HR 79 | Temp 98.0°F | Resp 16

## 2022-07-14 DIAGNOSIS — B084 Enteroviral vesicular stomatitis with exanthem: Secondary | ICD-10-CM

## 2022-07-14 NOTE — ED Provider Notes (Signed)
[facility]    CSN: [id number] Arrival date & time: 07/14/22  1350      History   Chief Complaint Chief Complaint  Patient presents with   Rash    Rash on palm of hands, feet, and tongue - Entered by patient    HPI [name] is a 27 y.o. female.   Patient presents with itchy rash to bilateral hands, soles of feet, possibly to mouth.  Reports that symptoms started about 4 to 5 days ago.  She recently tested positive for strep throat but reports those symptoms resolved, and these are new symptoms.  She denies any associated fever or known sick contacts but her child does attend daycare.  Reports that she touched a [employer] laundry detergent which she typically has an allergic reaction to but she is not sure if this is related. Patient is up to date on vaccines.    Rash   Past Medical History:  Diagnosis Date   Anxiety    Asthma    Headache     Patient Active Problem List   Diagnosis Date Noted   SVD (spontaneous vaginal delivery) 06/07/2017   Postpartum hemorrhage 06/07/2017   Labor and delivery indication for care or intervention 06/06/2017   Marijuana use 06/06/2017   Limited prenatal care in third trimester 03/27/2017   Imprisonment and other incarceration 03/23/2017   History of drug use 03/23/2017   Supervision of high risk pregnancy, antepartum 03/20/2017    Past Surgical History:  Procedure Laterality Date   FRACTURE SURGERY     ORTHOPEDIC SURGERY      OB History     Gravida  2   Para  1   Term  1   Preterm  0   AB  1   Living  1      SAB  1   IAB  0   Ectopic  0   Multiple  0   Live Births  1            Home Medications    Prior to Admission medications   Medication Sig Start Date End Date Taking? Authorizing Provider  diphenhydrAMINE (BENADRYL) 25 mg capsule Take 50 mg by mouth every 6 (six) hours as needed for itching or allergies.    [provider]  famotidine (PEPCID) 20 MG tablet Take 1  tablet (20 mg total) by mouth 2 (two) times daily. 09/07/17   Petrucelli, Samantha R, PA-C  predniSONE (DELTASONE) 10 MG tablet Take 4 tablets (40 mg total) by mouth daily. 09/07/17   Petrucelli, Pleas Koch, PA-C    Family History Family History  Problem Relation Age of Onset   COPD Mother    Heart disease Father     Social History Social History   Tobacco Use   Smoking status: Every Day    Packs/day: 1.00    Years: 3.00    Additional pack years: 0.00    Total pack years: 3.00    Types: Cigarettes   Smokeless tobacco: Never  Substance Use Topics   Alcohol use: No   Drug use: No     Allergies   Sulfamethoxazole-trimethoprim, Penicillins, and Peanut-containing drug products   Review of Systems Review of Systems Per HPI  Physical Exam Triage Vital Signs ED Triage Vitals [07/14/22 1415]  Enc Vitals Group     BP 107/71     Pulse Rate 79     Resp 16     Temp 98 F (36.7  C)     Temp Source Oral     SpO2 98 %     Weight      Height      Head Circumference      Peak Flow      Pain Score 0     Pain Loc      Pain Edu?      Excl. in GC?    No data found.  Updated Vital Signs BP 107/71 (BP Location: Left Arm)   Pulse 79   Temp 98 F (36.7 C) (Oral)   Resp 16   LMP 07/04/2022 (Exact Date)   SpO2 98%   Visual Acuity Right Eye Distance:   Left Eye Distance:   Bilateral Distance:    Right Eye Near:   Left Eye Near:    Bilateral Near:     Physical Exam Constitutional:      General: She is not in acute distress.    Appearance: Normal appearance. She is not toxic-appearing or diaphoretic.  HENT:     Head: Normocephalic and atraumatic.     Mouth/Throat:     Comments: Tongue appears normal.  No obvious lesions. Eyes:     Extraocular Movements: Extraocular movements intact.     Conjunctiva/sclera: Conjunctivae normal.  Pulmonary:     Effort: Pulmonary effort is normal.  Skin:    Comments: Patient has maculopapular rash present to palms of hands and  soles of feet.  No drainage noted.  Neurological:     General: No focal deficit present.     Mental Status: She is alert and oriented to person, place, and time. Mental status is at baseline.  Psychiatric:        Mood and Affect: Mood normal.        Behavior: Behavior normal.        Thought Content: Thought content normal.        Judgment: Judgment normal.      UC Treatments / Results  Labs (all labs ordered are listed, but only abnormal results are displayed) Labs Reviewed - No data to display  EKG   Radiology No results found.  Procedures Procedures (including critical care time)  Medications Ordered in UC Medications - No data to display  Initial Impression / Assessment and Plan / UC Course  I have reviewed the triage vital signs and the nursing notes.  Pertinent labs & imaging results that were available during my care of the patient were reviewed by me and considered in my medical decision making (see chart for details).     Physical Exam is consistent with hand, foot, mouth disease.  No findings on physical exam that would be suggestive of complications related to strep throat.  She was advised of supportive care and symptom management as this is a virus that will have to run its course.  Advised strict return precautions.  Patient verbalized understanding and was agreeable with plan. Final Clinical Impressions(s) / UC Diagnoses   Final diagnoses:  Hand, foot and mouth disease     Discharge Instructions      You have hand, foot, and mouth disease which is a virus that has to run its course.  I do not recommend that you apply anything topically.  If symptoms persist or worsen, please follow-up.    ED Prescriptions   None    PDMP not reviewed this encounter.   Gustavus Bryant, [place] 07/14/22 1438

## 2022-07-14 NOTE — Discharge Instructions (Addendum)
You have hand, foot, and mouth disease which is a virus that has to run its course.  I do not recommend that you apply anything topically.  If symptoms persist or worsen, please follow-up.

## 2022-07-14 NOTE — ED Triage Notes (Signed)
Patient presents to UC for rash on palms of hands, soles of feet, and strawnerry tongue noted Monday. Child just started daycare. No changes in meds, did change laundry detergent.   Denies fever.

## 2022-09-10 ENCOUNTER — Emergency Department (HOSPITAL_BASED_OUTPATIENT_CLINIC_OR_DEPARTMENT_OTHER)
Admission: EM | Admit: 2022-09-10 | Discharge: 2022-09-10 | Disposition: A | Discharge status: Home / Self Care | Payer: Self-pay | Attending: Emergency Medicine | Admitting: Emergency Medicine

## 2022-09-10 ENCOUNTER — Other Ambulatory Visit: Payer: Self-pay

## 2022-09-10 ENCOUNTER — Encounter (HOSPITAL_BASED_OUTPATIENT_CLINIC_OR_DEPARTMENT_OTHER): Payer: Self-pay | Admitting: Emergency Medicine

## 2022-09-10 ENCOUNTER — Emergency Department (HOSPITAL_BASED_OUTPATIENT_CLINIC_OR_DEPARTMENT_OTHER): Payer: Self-pay

## 2022-09-10 DIAGNOSIS — W172XXA Fall into hole, initial encounter: Secondary | ICD-10-CM | POA: Insufficient documentation

## 2022-09-10 DIAGNOSIS — Y92007 Garden or yard of unspecified non-institutional (private) residence as the place of occurrence of the external cause: Secondary | ICD-10-CM | POA: Insufficient documentation

## 2022-09-10 DIAGNOSIS — Z91010 Allergy to peanuts: Secondary | ICD-10-CM | POA: Insufficient documentation

## 2022-09-10 DIAGNOSIS — Y9302 Activity, running: Secondary | ICD-10-CM | POA: Insufficient documentation

## 2022-09-10 DIAGNOSIS — S93401A Sprain of unspecified ligament of right ankle, initial encounter: Secondary | ICD-10-CM | POA: Insufficient documentation

## 2022-09-10 NOTE — ED Provider Notes (Signed)
[place]GENC[facility] DEPARTMENT AT [facility] Provider Note   CSN: [id number] Arrival date & time: 09/10/22  1702     History  Chief Complaint  Patient presents with   Ankle Injury    [name] is a 27 y.o. female, history of hardware in right ankle, who presents to the ED secondary to falling in a hole yesterday.  She states she that her daughter was on a blowup bounce house, and she started running across the yard, when she fell into a hole, and hurt her right ankle.  She states that her ankle went inwards, and that she has had a lot of pain since then.  She states she cannot place any weight on it.  Has been using crutches, and not bearing weight.  Taken ibuprofen with some relief.     Home Medications Prior to Admission medications   Medication Sig Start Date End Date Taking? Authorizing Provider  diphenhydrAMINE (BENADRYL) 25 mg capsule Take 50 mg by mouth every 6 (six) hours as needed for itching or allergies.    [provider]  famotidine (PEPCID) 20 MG tablet Take 1 tablet (20 mg total) by mouth 2 (two) times daily. 09/07/17   Petrucelli, Samantha R, PA-C  predniSONE (DELTASONE) 10 MG tablet Take 4 tablets (40 mg total) by mouth daily. 09/07/17   Petrucelli, Samantha R, PA-C      Allergies    Sulfamethoxazole-trimethoprim, Penicillins, and Peanut-containing drug products    Review of Systems   Review of Systems  Musculoskeletal:  Positive for joint swelling.  Skin:  Negative for wound.    Physical Exam Updated Vital Signs BP 112/75 (BP Location: Right Arm)   Pulse 74   Temp (!) 97.3 F (36.3 C)   Resp 17   Ht 5\' 4"  (1.626 m)   Wt 50.3 kg   LMP 09/10/2022 (Exact Date)   SpO2 100%   BMI 19.05 kg/m  Physical Exam Vitals and nursing note reviewed.  Constitutional:      General: She is not in acute distress.    Appearance: She is well-developed.  HENT:     Head: Normocephalic and atraumatic.  Eyes:     General:        Right eye: No  discharge.        Left eye: No discharge.     Conjunctiva/sclera: Conjunctivae normal.  Pulmonary:     Effort: No respiratory distress.  Musculoskeletal:     Comments: Right ankle: TTP of lateral malleolus. Edema noted to lateral malleolus. Not able to bear weight. Able to plantar flex and dorsiflex ankle. Inversion/eversion intact. Negative Thompson test. No midfoot or base of 5th metatarsal tenderness to palpation. Capillary refill <2sec. Dorsalis pedis pulse present. No foot drop noted. Sensation intact. Warm to touch.    Neurological:     Mental Status: She is alert.     Comments: Clear speech.   Psychiatric:        Behavior: Behavior normal.        Thought Content: Thought content normal.     ED Results / Procedures / Treatments   Labs (all labs ordered are listed, but only abnormal results are displayed) Labs Reviewed - No data to display  EKG None  Radiology DG Ankle Right Port  Result Date: 09/10/2022 CLINICAL DATA:  Ankle injury yesterday.  Fell in a hole.  Pain. EXAM: PORTABLE RIGHT ANKLE - 2 VIEW COMPARISON:  Right ankle radiographs 09/15/2014 FINDINGS: Redemonstration of posterior approach transverse talar  fixation screw. Mild posterior tibiotalar joint space narrowing and peripheral osteophytosis, mildly increased from prior. Mild posterior subtalar joint space narrowing with posterior partial bridging osteophytosis, progressed from prior. No acute fracture is seen. No dislocation. Mild lateral ankle soft tissue swelling. IMPRESSION: 1. Mild lateral ankle soft tissue swelling without acute fracture. 2. 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 Electronically Signed   By: Neita Garnet M.D.   On: 09/10/2022 18:31    Procedures Procedures    Medications Ordered in ED Medications - No data to display  ED Course/ Medical Decision Making/ A&P                             Medical Decision Making Patient is a 27 year old female, here  for right ankle pain that occurred after falling in a hole yesterday.  She states since then she has not put any weight on it.  Is using crutches.  We will obtain x-ray for further evaluation  Amount and/or Complexity of Data Reviewed Radiology: ordered.    Details: Mild lateral ankle soft tissue swelling.  Osteoarthritis worse than prior Discussion of management or test interpretation with external provider(s): Discussed with patient, does have tenderness to palpation of the lateral ankle, difficulty bearing weight.  Achilles tendon intact.  We discussed boot, versus Aircast, she would prefer an Aircast.  Provided Aircast, and she already has crutches.  Will have her follow-up with orthopedics.  Return precautions emphasized.  Good pulse, good sensation.  Range of motion intact.  She is very painful.   Final Clinical Impression(s) / ED Diagnoses Final diagnoses:  Sprain of right ankle, unspecified ligament, initial encounter    Rx / DC Orders ED Discharge Orders     None         Braidyn Scorsone, [place], PA 09/10/22 1858    Alvira Monday, MD 09/11/22 2255

## 2022-09-10 NOTE — ED Triage Notes (Signed)
Pt via pov from home after an ankle injury yesterday. She was running in her yard and fell into a hole in the yard. Pt reports that she has severe pain when she bears weight. Pt shows up on crutches and reports that she has a pin in the ankle from prior surgery. Pt alert & oriented, nad noted.

## 2022-09-10 NOTE — Discharge Instructions (Signed)
Please follow-up with orthopedics as needed, take ibuprofen 800 mg up to 3 times a day, for pain control.  You can also supplement with Tylenol for the pain control.  Return to the ER if your you feel like your symptoms are getting worse.  You have loss of sensation in your foot, or lack of pulse.

## 2022-09-10 NOTE — ED Notes (Signed)
Pt verbalized understanding of d/c instructions, meds, and followup care. Denies questions. VSS, no distress noted. Steady gait to exit with all belongings.  ?

## 2023-03-13 NOTE — L&D Delivery Note (Signed)
   Delivery Note:   [id number] at 109w1d  Admitting diagnosis: Post-dates pregnancy [O48.0] Risks:  Patient Active Problem List   Diagnosis Date Noted   Post-dates pregnancy 11/14/2023   Tobacco smoking affecting pregnancy in third trimester 05/27/2023   Supervision of low-risk pregnancy 05/22/2023   History of postpartum hemorrhage, currently pregnant 06/07/2017   Marijuana use 06/06/2017   History of drug use 03/23/2017     First Stage:  Induction of labor:N/A  Onset of labor: SROM  Augmentation: Pitocin  ROM: AROM- just prior to delivery with a BBOW   Active labor onset: SROM Analgesia /Anesthesia/Pain control intrapartum: Epidural  Second Stage:  Complete dilation at   1230 Onset of pushing at 1233 FHR second stage CAt II with moderate variability, variable decels with contractions ans quick return to baseline.    CNM called to patient bedside to assess for AROM. Reviewed risks and benefits with patient and family. Patient desires to proceed with AROM. FHT Cat 2 prior to AROM. SVE 10/100/0 BBOW S. Emilio CNM. Fetal head well applied to cervix, AROM successful on first attempt with the use of amnihook. Heavy mec stained fluid noted. Patient and fetus tolerated well and FHT remained Cat 2 following AROM.  Pushing in lithotomy position with CNM and L&D staff support. FOB and Patinets mother present for birth and supportive.   Delivery of a Live born female  Birth Weight: 8 lb 6.4 oz (3810 g) APGAR: 7, 8  Newborn Delivery   Birth date/time: 11/15/2023 12:38:00 Delivery type: Vaginal, Spontaneous    With great maternal pushing efforts fetus delivered in cephalic presentation, position DOA and spontaneously restituted to ROA position. Thick mec stained fluid and NICU team present for delivery. Nuchal Cord: Yes  delivered via somersault maneuver    After 1 mins of life cord double clamped after cessation of pulsation, and cut by FOB. Infant taken over to the warmer where he was  cared for by NICU. Please see their note for details.  Collection of cord blood for typing completed. Cord blood donation-None Arterial cord blood sample-No   Third Stage:  With gentle cord traction and LUS massage, Placenta delivered-Spontaneous intact with 3 vessels. Uterine tone firm bleeding minimal. .  Uterotonics: IV pit bolus and TXA given, with previous hx of PPH Placenta to L&D for dispo .  2nd degree laceration identified.  Episiotomy:None Local analgesia: N/a   Repair:2nd degree repaired in traditional fashion using a 2.0 Vicryl to hemostasis.  Est. Blood Loss (mL):371.00  Complications: None   Mom to postpartum.  Baby boy Zaccariah to Couplet care / Skin to Skin.  Delivery Report:  Review the Delivery Report for details.    Jaleisa Brose Erven) Emilio, MSN, CNM  Center for [facility]  11/15/23 2:47 PM

## 2023-04-18 ENCOUNTER — Ambulatory Visit
Admission: EM | Admit: 2023-04-18 | Discharge: 2023-04-18 | Disposition: A | Discharge status: Home / Self Care | Payer: Medicaid Other | Attending: Internal Medicine | Admitting: Internal Medicine

## 2023-04-18 ENCOUNTER — Encounter: Payer: Self-pay | Admitting: Emergency Medicine

## 2023-04-18 DIAGNOSIS — Z3201 Encounter for pregnancy test, result positive: Secondary | ICD-10-CM | POA: Diagnosis not present

## 2023-04-18 LAB — POCT URINE PREGNANCY: Preg Test, Ur: POSITIVE — AB

## 2023-04-18 LAB — POCT URINALYSIS DIP (MANUAL ENTRY)
Bilirubin, UA: NEGATIVE
Blood, UA: NEGATIVE
Glucose, UA: NEGATIVE mg/dL
Ketones, POC UA: NEGATIVE mg/dL
Leukocytes, UA: NEGATIVE
Nitrite, UA: NEGATIVE
Protein Ur, POC: NEGATIVE mg/dL
Spec Grav, UA: 1.02 (ref 1.010–1.025)
Urobilinogen, UA: 0.2 U/dL
pH, UA: 7 (ref 5.0–8.0)

## 2023-04-18 NOTE — ED Provider Notes (Signed)
 [facility]    CSN: [id number] Arrival date & time: 04/18/23  0941      History   Chief Complaint Chief Complaint  Patient presents with   Possible Pregnancy    HPI [name] is a 28 y.o. female.   Patient presents to urgent care for pregnancy testing for confirmation.  She believes her last menstrual cycle to be the first week of December 2024.  Denies recent vaginal bleeding, vaginal odor, vaginal itching, and vaginal discharge.  No abdominal pain, low back pain, dizziness, urinary symptoms, or fever/chills.  No concern for STD.  She is experiencing nausea with nonbilious/nonbloody vomiting intermittently but has been able to keep fluids down without vomiting.   Possible Pregnancy    Past Medical History:  Diagnosis Date   Anxiety    Asthma    Headache     Patient Active Problem List   Diagnosis Date Noted   SVD (spontaneous vaginal delivery) 06/07/2017   Postpartum hemorrhage 06/07/2017   Labor and delivery indication for care or intervention 06/06/2017   Marijuana use 06/06/2017   Limited prenatal care in third trimester 03/27/2017   Imprisonment and other incarceration 03/23/2017   History of drug use 03/23/2017   Supervision of high risk pregnancy, antepartum 03/20/2017    Past Surgical History:  Procedure Laterality Date   FRACTURE SURGERY     ORTHOPEDIC SURGERY      OB History     Gravida  3   Para  1   Term  1   Preterm  0   AB  1   Living  1      SAB  1   IAB  0   Ectopic  0   Multiple  0   Live Births  1            Home Medications    Prior to Admission medications   Medication Sig Start Date End Date Taking? Authorizing Provider  diphenhydrAMINE  (BENADRYL ) 25 mg capsule Take 50 mg by mouth every 6 (six) hours as needed for itching or allergies. Patient not taking: Reported on 04/18/2023    [provider]  famotidine  (PEPCID ) 20 MG tablet Take 1 tablet (20 mg total) by mouth 2 (two) times  daily. Patient not taking: Reported on 04/18/2023 09/07/17   Petrucelli, Samantha R, PA-C  predniSONE  (DELTASONE ) 10 MG tablet Take 4 tablets (40 mg total) by mouth daily. Patient not taking: Reported on 04/18/2023 09/07/17   Petrucelli, Lucie SAUNDERS, PA-C    Family History Family History  Problem Relation Age of Onset   COPD Mother    Heart disease Father     Social History Social History   Tobacco Use   Smoking status: Former    Current packs/day: 1.00    Average packs/day: 1 pack/day for 3.0 years (3.0 ttl pk-yrs)    Types: Cigarettes   Smokeless tobacco: Never  Vaping Use   Vaping status: Every Day  Substance Use Topics   Alcohol use: No    Comment: occasionally   Drug use: Yes    Types: Marijuana    Comment: occasionally     Allergies   Sulfamethoxazole -trimethoprim  and Penicillins   Review of Systems Review of Systems Per HPI  Physical Exam Triage Vital Signs ED Triage Vitals  Encounter Vitals Group     BP 04/18/23 1013 92/60     Systolic BP Percentile --      Diastolic BP Percentile --  Pulse Rate 04/18/23 1013 81     Resp 04/18/23 1013 16     Temp 04/18/23 1013 98.6 F (37 C)     Temp Source 04/18/23 1013 Oral     SpO2 04/18/23 1013 96 %     Weight --      Height --      Head Circumference --      Peak Flow --      Pain Score 04/18/23 1018 0     Pain Loc --      Pain Education --      Exclude from Growth Chart --    No data found.  Updated Vital Signs BP 92/60 (BP Location: Right Arm)   Pulse 81   Temp 98.6 F (37 C) (Oral)   Resp 16   LMP 09/10/2022 (Exact Date)   SpO2 96%   Visual Acuity Right Eye Distance:   Left Eye Distance:   Bilateral Distance:    Right Eye Near:   Left Eye Near:    Bilateral Near:     Physical Exam Vitals and nursing note reviewed.  Constitutional:      Appearance: She is not ill-appearing or toxic-appearing.  HENT:     Head: Normocephalic and atraumatic.     Right Ear: Hearing and external ear  normal.     Left Ear: Hearing and external ear normal.     Nose: Nose normal.     Mouth/Throat:     Lips: Pink.  Eyes:     General: Lids are normal. Vision grossly intact. Gaze aligned appropriately.     Extraocular Movements: Extraocular movements intact.     Conjunctiva/sclera: Conjunctivae normal.  Pulmonary:     Effort: Pulmonary effort is normal.  Musculoskeletal:     Cervical back: Neck supple.  Skin:    General: Skin is warm and dry.     Capillary Refill: Capillary refill takes less than 2 seconds.     Findings: No rash.  Neurological:     General: No focal deficit present.     Mental Status: She is alert and oriented to person, place, and time. Mental status is at baseline.     Cranial Nerves: No dysarthria or facial asymmetry.  Psychiatric:        Mood and Affect: Mood normal.        Speech: Speech normal.        Behavior: Behavior normal.        Thought Content: Thought content normal.        Judgment: Judgment normal.      UC Treatments / Results  Labs (all labs ordered are listed, but only abnormal results are displayed) Labs Reviewed  POCT URINE PREGNANCY - Abnormal; Notable for the following components:      Result Value   Preg Test, Ur Positive (*)    All other components within normal limits  POCT URINALYSIS DIP (MANUAL ENTRY)    EKG   Radiology No results found.  Procedures Procedures (including critical care time)  Medications Ordered in UC Medications - No data to display  Initial Impression / Assessment and Plan / UC Course  I have reviewed the triage vital signs and the nursing notes.  Pertinent labs & imaging results that were available during my care of the patient were reviewed by me and considered in my medical decision making (see chart for details).   1.  Positive urine pregnancy test Urinalysis is unremarkable for signs of urinary tract infection,  therefore deferred urine culture to screen for acute asymptomatic cystitis in  pregnancy. Urine pregnancy test is positive in the clinic.   Prenatal vitamin daily, stop ibuprofen , may take tylenol  for pain instead.  Given list of safe OTC medications in pregnancy.  OB/GYN provider list given, advised to schedule appointment for prenatal care MAU precautions given, no current red flag signs of pregnancy related emergency.    Counseled patient on potential for adverse effects with medications prescribed/recommended today, strict ER and return-to-clinic precautions discussed, patient verbalized understanding.    Final Clinical Impressions(s) / UC Diagnoses   Final diagnoses:  Positive urine pregnancy test     Discharge Instructions      Your pregnancy test is positive.  - Refer to the list of safe medications in pregnancy you were given at urgent care today and do not take any other medications over-the-counter other than what is on this list.  You may only take Tylenol  for pain and discomfort/fever.  Do not take ibuprofen  in pregnancy.  - Drink plenty of water (at least 8 cups/day) to stay well-hydrated.  - Purchase a prenatal vitamin over the counter and take this once daily.  - Avoid smoking cigarettes, marijuana, and other drug use. Do not drink alcohol while pregnant.  If you develop any severe abdominal pain, severe low back pain, vaginal bleeding, or any other pregnancy related emergency, please go to the maternity assessment unit located at entrance C of [facility].  Call the OB/GYN listed below to schedule an appointment to establish care so that you can be monitored throughout your pregnancy.   Center for [facility] at [facility]            [address], [place], [place] [address] [phone]   Center for [facility] at [facility]                                                            [address], [place], [place], [address] [phone]   ED Prescriptions   None    PDMP not reviewed this  encounter.   Enedelia Dorna HERO, [place] 04/19/23 [phone]

## 2023-04-18 NOTE — Discharge Instructions (Signed)
Your pregnancy test is positive.  - Refer to the list of safe medications in pregnancy you were given at urgent care today and do not take any other medications over-the-counter other than what is on this list.  You may only take Tylenol for pain and discomfort/fever.  Do not take ibuprofen in pregnancy.  - Drink plenty of water (at least 8 cups/day) to stay well-hydrated.  - Purchase a prenatal vitamin over the counter and take this once daily.  - Avoid smoking cigarettes, marijuana, and other drug use. Do not drink alcohol while pregnant.  If you develop any severe abdominal pain, severe low back pain, vaginal bleeding, or any other pregnancy related emergency, please go to the maternity assessment unit located at entrance C of [facility].  Call the OB/GYN listed below to schedule an appointment to establish care so that you can be monitored throughout your pregnancy.   Center for [employer] at [facility]            [address], [place], [place] [address] [phone]   Center for [employer] at [facility]                                                            [address], [address], [place], [place], [address] [phone]

## 2023-04-18 NOTE — ED Triage Notes (Signed)
 Pt believes she is pregnant and wants confirmation.

## 2023-05-02 ENCOUNTER — Telehealth: Payer: Medicaid Other

## 2023-05-09 ENCOUNTER — Encounter: Payer: Medicaid Other | Admitting: Obstetrics and Gynecology

## 2023-05-20 ENCOUNTER — Inpatient Hospital Stay (HOSPITAL_COMMUNITY)
Admission: AD | Admit: 2023-05-20 | Discharge: 2023-05-20 | Disposition: A | Discharge status: Home / Self Care | Attending: Obstetrics and Gynecology | Admitting: Obstetrics and Gynecology

## 2023-05-20 ENCOUNTER — Encounter (HOSPITAL_COMMUNITY): Payer: Self-pay | Admitting: Obstetrics and Gynecology

## 2023-05-20 DIAGNOSIS — J069 Acute upper respiratory infection, unspecified: Secondary | ICD-10-CM

## 2023-05-20 DIAGNOSIS — Z3A13 13 weeks gestation of pregnancy: Secondary | ICD-10-CM | POA: Diagnosis not present

## 2023-05-20 DIAGNOSIS — O99511 Diseases of the respiratory system complicating pregnancy, first trimester: Secondary | ICD-10-CM | POA: Insufficient documentation

## 2023-05-20 DIAGNOSIS — Z3491 Encounter for supervision of normal pregnancy, unspecified, first trimester: Secondary | ICD-10-CM

## 2023-05-20 DIAGNOSIS — O210 Mild hyperemesis gravidarum: Secondary | ICD-10-CM | POA: Diagnosis present

## 2023-05-20 LAB — URINALYSIS, ROUTINE W REFLEX MICROSCOPIC
Bilirubin Urine: NEGATIVE
Glucose, UA: NEGATIVE mg/dL
Hgb urine dipstick: NEGATIVE
Ketones, ur: NEGATIVE mg/dL
Leukocytes,Ua: NEGATIVE
Nitrite: NEGATIVE
Protein, ur: NEGATIVE mg/dL
Specific Gravity, Urine: 1.006 (ref 1.005–1.030)
pH: 7 (ref 5.0–8.0)

## 2023-05-20 LAB — RESP PANEL BY RT-PCR (RSV, FLU A&B, COVID)  RVPGX2
Influenza A by PCR: NEGATIVE
Influenza B by PCR: NEGATIVE
Resp Syncytial Virus by PCR: NEGATIVE
SARS Coronavirus 2 by RT PCR: NEGATIVE

## 2023-05-20 LAB — POCT PREGNANCY, URINE: Preg Test, Ur: POSITIVE — AB

## 2023-05-20 MED ORDER — PROMETHAZINE HCL 12.5 MG PO TABS: 12.5000 mg | ORAL_TABLET | Freq: Four times a day (QID) | ORAL | 0 refills | Status: DC | PRN

## 2023-05-20 NOTE — MAU Note (Signed)
.  [name] is a 28 y.o. at Unknown here in MAU reporting: started having nausea, emesis, body aches (8/10), and sore throat (8/10) that started around 0200 this morning. Reports sweats and fever - did not take temperature. Last took tylenol at 1000 and phenergan about an hour ago. Reports 8-9 episodes of emesis - has not had any episodes of emesis since taking the phenergan. Has not eaten today, but has been drinking gatorade. Denies VB or LOF.   LMP: beginning of December; has 1st prenatal visit scheduled at [facility] Onset of complaint: today Pain score: see note above Vitals:   05/20/23 1827  BP: (!) 94/50  Pulse: 90  Resp: 18  Temp: 98.2 F (36.8 C)  SpO2: 100%     Lab orders placed from triage: UA preg

## 2023-05-20 NOTE — MAU Provider Note (Signed)
 History     CSN: [id number]  Arrival date and time: 05/20/23 1758   Event Date/Time   First Provider Initiated Contact with Patient 05/20/23 1944      Chief Complaint  Patient presents with  . Nausea  . Emesis  . Generalized Body Aches  . Sore Throat   HPI Ms. CHANY WOOLWORTH is a 28 y.o. year old [id number] female at [redacted]w[redacted]d weeks gestation who presents to MAU reporting N/V, body aches, and sore throat since 0200 this AM. She took some Tylenol at 1000 this morning. She reports she had 8-9 episodes of vomiting until she took some of her step daughter's Phenergan Rx @ 1730. She has not had anything to eat all day. She reports "feeling weak in the knees until chugging a bottle of Gatorade." She denies VB or LOF. She reports she had the flu "a couple of months ago." She states that if she has COVID, RSV or Flu, she will treat herself with OTC medications at home. She plans to receive [facility] with [facility]; next appt is 05/22/2023.   OB History     Gravida  4   Para  2   Term  2   Preterm  0   AB  1   Living  2      SAB  1   IAB  0   Ectopic  0   Multiple  0   Live Births  2           Past Medical History:  Diagnosis Date  . Anxiety   . Asthma   . Headache     Past Surgical History:  Procedure Laterality Date  . FRACTURE SURGERY    . ORTHOPEDIC SURGERY      Family History  Problem Relation Age of Onset  . COPD Mother   . Heart disease Father     Social History   Tobacco Use  . Smoking status: Every Day    Current packs/day: 0.50    Average packs/day: 1 pack/day for 3.3 years (3.1 ttl pk-yrs)    Types: Cigarettes    Start date: 02/2023  . Smokeless tobacco: Never  Vaping Use  . Vaping status: Every Day  Substance Use Topics  . Alcohol use: No    Comment: occasionally  . Drug use: Yes    Types: Marijuana    Comment: occasionally    Allergies:  Allergies  Allergen Reactions  . Sulfamethoxazole-Trimethoprim Hives, Itching and Nausea And Vomiting   . Penicillins Other (See Comments)    Has patient had a PCN reaction causing immediate rash, facial/tongue/throat swelling, SOB or lightheadedness with hypotension: Unknown Has patient had a PCN reaction causing severe rash involving mucus membranes or skin necrosis: Unknown Has patient had a PCN reaction that required hospitalization: Unknown Has patient had a PCN reaction occurring within the last 10 years: Unknown If all of the above answers are "NO", then may proceed with Cephalosporin use.     Medications Prior to Admission  Medication Sig Dispense Refill Last Dose/Taking  . acetaminophen (TYLENOL) 500 MG tablet Take 1,000 mg by mouth every 6 (six) hours as needed.   05/20/2023 Morning  . promethazine (PHENERGAN) 12.5 MG tablet Take 12.5 mg by mouth every 6 (six) hours as needed for nausea or vomiting.   05/20/2023 Morning  . diphenhydrAMINE (BENADRYL) 25 mg capsule Take 50 mg by mouth every 6 (six) hours as needed for itching or allergies. (Patient not taking: Reported on 04/18/2023)     .  famotidine (PEPCID) 20 MG tablet Take 1 tablet (20 mg total) by mouth 2 (two) times daily. (Patient not taking: Reported on 04/18/2023) 30 tablet 0   . predniSONE (DELTASONE) 10 MG tablet Take 4 tablets (40 mg total) by mouth daily. (Patient not taking: Reported on 04/18/2023) 20 tablet 0     Review of Systems  Constitutional: Negative.   HENT: Negative.    Respiratory: Negative.    Cardiovascular: Negative.   Gastrointestinal: Negative.    Physical Exam   Blood pressure (!) 102/51, pulse 81, temperature 98.2 F (36.8 C), temperature source Oral, resp. rate 18, height 5\' 3"  (1.6 m), weight 52.3 kg, last menstrual period 09/10/2022, SpO2 99%.  Physical Exam  MAU Course  Procedures  MDM ***  Assessment and Plan  ***  Raelyn Mora, CNM 05/20/2023, 7:44 PM

## 2023-05-20 NOTE — Discharge Instructions (Signed)

## 2023-05-21 ENCOUNTER — Encounter: Payer: Self-pay | Admitting: Obstetrics and Gynecology

## 2023-05-21 LAB — GROUP A STREP BY PCR: Group A Strep by PCR: NOT DETECTED

## 2023-05-22 ENCOUNTER — Telehealth: Payer: Medicaid Other

## 2023-05-22 DIAGNOSIS — Z3490 Encounter for supervision of normal pregnancy, unspecified, unspecified trimester: Secondary | ICD-10-CM | POA: Insufficient documentation

## 2023-05-22 DIAGNOSIS — Z3480 Encounter for supervision of other normal pregnancy, unspecified trimester: Secondary | ICD-10-CM

## 2023-05-22 MED ORDER — BLOOD PRESSURE MONITORING DEVI
1.0000 | 0 refills | Status: DC
Start: 2023-05-22 — End: 2023-06-26

## 2023-05-22 NOTE — Progress Notes (Signed)
 New OB Intake  I connected with [name]  on 05/22/23 at 10:15 AM EDT by MyChart Video Visit and verified that I am speaking with the correct person using two identifiers. Nurse is located at [facility] and pt is located at home.  I discussed the limitations, risks, security and privacy concerns of performing an evaluation and management service by telephone and the availability of in person appointments. I also discussed with the patient that there may be a patient responsible charge related to this service. The patient expressed understanding and agreed to proceed.  I explained I am completing New OB Intake today. We discussed EDD of 11/19/2023, by Last Menstrual Period. Pt is [id number]. I reviewed her allergies, medications and Medical/Surgical/OB history.    Patient Active Problem List   Diagnosis Date Noted   Supervision of other normal pregnancy, antepartum 05/22/2023   SVD (spontaneous vaginal delivery) 06/07/2017   Postpartum hemorrhage 06/07/2017   Labor and delivery indication for care or intervention 06/06/2017   Marijuana use 06/06/2017   Limited prenatal care in third trimester 03/27/2017   Imprisonment and other incarceration 03/23/2017   History of drug use 03/23/2017   Supervision of high risk pregnancy, antepartum 03/20/2017    Concerns addressed today  Delivery Plans Plans to deliver at [facility]. Discussed the nature of our practice with multiple providers including residents and students. Due to the size of the practice, the delivering provider may not be the same as those providing prenatal care.   Patient is not interested in water birth. Offered upcoming OB visit with CNM to discuss further.  MyChart/Babyscripts MyChart access verified. I explained pt will have some visits in office and some virtually. Babyscripts instructions given and order placed. Patient verifies receipt of registration text/e-mail. Account successfully created and app downloaded. If patient  is a candidate for Optimized scheduling, add to sticky note.   Blood Pressure Cuff/Weight Scale Blood pressure cuff ordered for patient to pick-up from [employer]. Explained after first prenatal appt pt will check weekly and document in Babyscripts. Patient does have weight scale.  Anatomy US Explained first scheduled [place] will be around 19 weeks. Anatomy US scheduled for 06/27/23 at 0815a.  Is patient a CenteringPregnancy candidate?  Declined Declined due to Schedule  If accepted,    Is patient a Mom+Baby Combined Care candidate?  Not a candidate   If accepted, confirm patient does not intend to move from the area for at least 12 months, then notify Mom+Baby staff  Interested in [place]? If yes, send referral and doula dot phrase.   Is patient a candidate for Babyscripts Optimization?    First visit review I reviewed new OB appt with patient. Explained pt will be seen by Albertine Grates, FNP at first visit. Discussed Avelina Laine genetic screening with patient. Needs Panorama and Horizon.. Routine prenatal labs  needed at [facility].    Last Pap No results found for: "DIAGPAP"  Henrietta Dine, [facility] 05/22/2023  10:47 AM

## 2023-05-22 NOTE — Patient Instructions (Signed)

## 2023-05-27 ENCOUNTER — Encounter: Payer: Self-pay | Admitting: Obstetrics and Gynecology

## 2023-05-27 ENCOUNTER — Other Ambulatory Visit: Payer: Self-pay

## 2023-05-27 ENCOUNTER — Other Ambulatory Visit (HOSPITAL_COMMUNITY)
Admission: RE | Admit: 2023-05-27 | Discharge: 2023-05-27 | Disposition: A | Discharge status: Home / Self Care | Source: Ambulatory Visit | Attending: Obstetrics and Gynecology | Admitting: Obstetrics and Gynecology

## 2023-05-27 ENCOUNTER — Ambulatory Visit (INDEPENDENT_AMBULATORY_CARE_PROVIDER_SITE_OTHER): Payer: Medicaid Other | Admitting: Obstetrics and Gynecology

## 2023-05-27 VITALS — BP 105/67 | HR 85 | Wt 113.8 lb

## 2023-05-27 DIAGNOSIS — Z3480 Encounter for supervision of other normal pregnancy, unspecified trimester: Secondary | ICD-10-CM | POA: Insufficient documentation

## 2023-05-27 DIAGNOSIS — O99332 Smoking (tobacco) complicating pregnancy, second trimester: Secondary | ICD-10-CM | POA: Diagnosis not present

## 2023-05-27 DIAGNOSIS — Z3482 Encounter for supervision of other normal pregnancy, second trimester: Secondary | ICD-10-CM | POA: Diagnosis not present

## 2023-05-27 DIAGNOSIS — Z1332 Encounter for screening for maternal depression: Secondary | ICD-10-CM | POA: Diagnosis not present

## 2023-05-27 DIAGNOSIS — O09299 Supervision of pregnancy with other poor reproductive or obstetric history, unspecified trimester: Secondary | ICD-10-CM

## 2023-05-27 DIAGNOSIS — O99333 Smoking (tobacco) complicating pregnancy, third trimester: Secondary | ICD-10-CM | POA: Insufficient documentation

## 2023-05-27 DIAGNOSIS — O09292 Supervision of pregnancy with other poor reproductive or obstetric history, second trimester: Secondary | ICD-10-CM | POA: Diagnosis not present

## 2023-05-27 DIAGNOSIS — Z3A14 14 weeks gestation of pregnancy: Secondary | ICD-10-CM | POA: Insufficient documentation

## 2023-05-27 MED ORDER — NICOTINE 14 MG/24HR TD PT24
14.0000 mg | MEDICATED_PATCH | Freq: Every day | TRANSDERMAL | 0 refills | Status: DC
Start: 2023-05-27 — End: 2023-07-08

## 2023-05-27 MED ORDER — PREPLUS 27-1 MG PO TABS
1.0000 | ORAL_TABLET | Freq: Every day | ORAL | 13 refills | Status: AC
Start: 2023-05-27 — End: ?

## 2023-05-27 NOTE — Patient Instructions (Signed)
 [facility] [address], [place], [place] [address] [phone] Hours: Sunday Closed Monday 9AM-6PM Tuesday 9AM-6PM Wednesday 9AM-6PM Thursday 9AM-6PM Friday           9AM-6PM Saturday         10 AM-1PM

## 2023-05-27 NOTE — Progress Notes (Signed)
 INITIAL PRENATAL VISIT  Subjective:   [name] is being seen today for her first obstetrical visit. She is at [redacted]w[redacted]d gestation by LMP Her obstetrical history is significant for smoker. Relationship with FOB: significant other, living together. Patient  does  intend to breast feed. Pregnancy history fully reviewed.  Patient reports no complaints.  Indications for ASA therapy (per uptodate)  Two or more of the following: Nulliparity No Obesity (body mass index >30 kg/m2) No Family history of preeclampsia in mother or sister No Age >=35 years No Sociodemographic characteristics (African American race, low socioeconomic level) No Personal risk factors (eg, previous pregnancy with low birth weight or small for gestational age infant, previous adverse pregnancy outcome [eg, stillbirth], interval >10 years between pregnancies) No   Objective:    Obstetric History OB History  Gravida Para Term Preterm AB Living  4 2 2  0 1 2  SAB IAB Ectopic Multiple Live Births  1 0 0 0 2    # Outcome Date GA Lbr Len/2nd Weight Sex Type Anes PTL Lv  4 Current           3 Term 02/03/20 [redacted]w[redacted]d   F Vag-Spont  N LIV  2 Term 06/07/17 [redacted]w[redacted]d 17:15 / 02:03 7 lb 11.8 oz (3.51 kg) F Vag-Spont EPI  LIV  1 SAB 2015            Past Medical History:  Diagnosis Date   Anxiety    Asthma    Headache    Labor and delivery indication for care or intervention 06/06/2017   Limited prenatal care in third trimester 03/27/2017   Started care at 26 wks     Supervision of high risk pregnancy, antepartum 03/20/2017            Clinic     [facility]    Prenatal Labs      Dating     unsure LMP --- updated by 3rd trimester u/s    Blood type:   O positive      Genetic Screen    1 Screen:    AFP:     Quad: late to care    NIPS:    Antibody: negative      Anatomic [place]     female, limited views of heart & spine  [x ] f/u in 4 wks - normal, mostly complete, no additional [place] indicated  EDD updated    Rubella:  immune      G    SVD (spontaneous vaginal delivery) 06/07/2017   Please schedule this patient for PP visit in: 4 weeks  Low risk pregnancy complicated by: drug use, limited PNC, incarceration  Delivery mode:  SVD  Anticipated Birth Control:  IUD  PP Procedures needed: none   Schedule Integrated BH visit: no  Provider: either      Past Surgical History:  Procedure Laterality Date   FRACTURE SURGERY     ORTHOPEDIC SURGERY      Current Outpatient Medications on File Prior to Visit  Medication Sig Dispense Refill   acetaminophen (TYLENOL) 500 MG tablet Take 1,000 mg by mouth every 6 (six) hours as needed.     diphenhydrAMINE (BENADRYL) 25 mg capsule Take 25 mg by mouth every 6 (six) hours as needed for itching or allergies.     promethazine (PHENERGAN) 12.5 MG tablet Take 1 tablet (12.5 mg total) by mouth every 6 (six) hours as needed for nausea or vomiting. 30 tablet 0   Blood Pressure  Monitoring DEVI 1 each by Does not apply route once a week. (Patient not taking: Reported on 05/27/2023) 1 each 0   No current facility-administered medications on file prior to visit.    Allergies  Allergen Reactions   Sulfamethoxazole-Trimethoprim Hives, Itching and Nausea And Vomiting   Penicillins Other (See Comments)    Has patient had a PCN reaction causing immediate rash, facial/tongue/throat swelling, SOB or lightheadedness with hypotension: Unknown Has patient had a PCN reaction causing severe rash involving mucus membranes or skin necrosis: Unknown Has patient had a PCN reaction that required hospitalization: Unknown Has patient had a PCN reaction occurring within the last 10 years: Unknown If all of the above answers are "NO", then may proceed with Cephalosporin use.     Social History:  reports that she has been smoking cigarettes. She started smoking about 3 months ago. She has a 3.1 pack-year smoking history. She has never used smokeless tobacco. She reports current drug use. Drug: Marijuana. She reports that  she does not drink alcohol.  Family History  Problem Relation Age of Onset   COPD Mother    Heart disease Father     The following portions of the patient's history were reviewed and updated as appropriate: allergies, current medications, past family history, past medical history, past social history, past surgical history and problem list.  Review of Systems Review of Systems  All other systems reviewed and are negative.   Physical Exam:  BP 105/67   Pulse 85   Wt 113 lb 12.8 oz (51.6 kg)   LMP 02/12/2023 (Approximate)   BMI 20.16 kg/m  CONSTITUTIONAL: Well-developed, well-nourished female in no acute distress.  HENT:  Normocephalic, atraumatic.   EYES: Conjunctivae normal. NECK: Normal range of motion SKIN: Skin is warm and dry. MUSCULOSKELETAL: Normal range of motion NEUROLOGIC: Alert and oriented  PSYCHIATRIC: Normal mood and affect. Normal behavior.  CARDIOVASCULAR: Normal heart rate noted RESPIRATORY: normal effort ABDOMEN: Soft PELVIC:Pelvic: normal appearing vulva with no masses, tenderness or lesions  VAGINA: normal appearing vagina with normal color and discharge, no lesions  CERVIX: normal appearing cervix without discharge or lesions, no CMT  Thin prep pap is done   UTERUS: uterus is felt to be normal size, shape, consistency and nontender   ADNEXA: No adnexal masses or tenderness noted.  Extremities:  No swelling or varicosities noted   Fetal Heart Rate (bpm): 155           Assessment:    Pregnancy: [id number]  1. Supervision of other normal pregnancy, antepartum (Primary) 2. [redacted] weeks gestation of pregnancy BP and FHR normal Doing well, feeling regular movement  Considering BTL, to dicsuss at consecutive visits, discussed consent at 28 weeks   - CBC/D/Plt+RPR+Rh+ABO+RubIgG... - HgB A1c - PANORAMA PRENATAL TEST - [place] Basic Panel - Cytology - PAP( [place]) - Culture, OB Urine - Prenatal Vit-Fe Fumarate-FA (PREPLUS) 27-1 MG TABS; Take 1 tablet  by mouth daily.  Dispense: 30 tablet; Refill: 13  3. Tobacco smoking affecting pregnancy in second trimester  - nicotine (NICODERM CQ - DOSED IN MG/24 HOURS) 14 mg/24hr patch; Place 1 patch (14 mg total) onto the skin daily.  Dispense: 28 patch; Refill: 0  4. History of postpartum hemorrhage, currently pregnant Reports significant amount of bleeding at delivery of first child, unsure if she had to have blood products     Plan:     Initial labs drawn. Prenatal vitamins. Problem list reviewed and updated. Reviewed in detail the nature of the  practice with collaborative care between  Genetic screening discussed: NIPS/First trimester screen/Quad/AFP ordered. Discussed clinic routines, schedule of care and testing, genetic screening options, involvement of students and residents under the direct supervision of APPs and doctors and presence of female providers. Pt verbalized understanding.  Return in 4 weeks for routine prenatal Future Appointments  Date Time Provider Department Center  06/27/2023  8:15 AM [facility] NURSE [facility]  06/27/2023  8:30 AM [facility] US5 [facility]  06/27/2023  9:35 AM Dorathy Kinsman, CNM [facility]    Sue Lush, FNP

## 2023-05-29 ENCOUNTER — Encounter: Payer: Self-pay | Admitting: *Deleted

## 2023-05-29 LAB — CBC/D/PLT+RPR+RH+ABO+RUBIGG...
Antibody Screen: NEGATIVE
Basophils Absolute: 0 10*3/uL (ref 0.0–0.2)
Basos: 0 %
EOS (ABSOLUTE): 0.2 10*3/uL (ref 0.0–0.4)
Eos: 1 %
HCV Ab: NONREACTIVE
HIV Screen 4th Generation wRfx: NONREACTIVE
Hematocrit: 36.5 % (ref 34.0–46.6)
Hemoglobin: 12.7 g/dL (ref 11.1–15.9)
Hepatitis B Surface Ag: NEGATIVE
Immature Grans (Abs): 0.1 10*3/uL (ref 0.0–0.1)
Immature Granulocytes: 1 %
Lymphocytes Absolute: 2.3 10*3/uL (ref 0.7–3.1)
Lymphs: 16 %
MCH: 30.6 pg (ref 26.6–33.0)
MCHC: 34.8 g/dL (ref 31.5–35.7)
MCV: 88 fL (ref 79–97)
Monocytes Absolute: 0.9 10*3/uL (ref 0.1–0.9)
Monocytes: 6 %
Neutrophils Absolute: 10.8 10*3/uL — ABNORMAL HIGH (ref 1.4–7.0)
Neutrophils: 76 %
Platelets: 231 10*3/uL (ref 150–450)
RBC: 4.15 x10E6/uL (ref 3.77–5.28)
RDW: 14.9 % (ref 11.7–15.4)
RPR Ser Ql: NONREACTIVE
Rh Factor: POSITIVE
Rubella Antibodies, IGG: 6.01 {index} (ref >0.99)
WBC: 14.3 10*3/uL — ABNORMAL HIGH (ref 3.4–10.8)

## 2023-05-29 LAB — CULTURE, OB URINE

## 2023-05-29 LAB — CYTOLOGY - PAP
Chlamydia: NEGATIVE
Comment: NEGATIVE
Comment: NORMAL
Diagnosis: NEGATIVE
Neisseria Gonorrhea: NEGATIVE

## 2023-05-29 LAB — HEMOGLOBIN A1C
Est. average glucose Bld gHb Est-mCnc: 94 mg/dL
Hgb A1c MFr Bld: 4.9 % (ref 4.8–5.6)

## 2023-05-29 LAB — HCV INTERPRETATION

## 2023-05-29 LAB — URINE CULTURE, OB REFLEX

## 2023-06-02 LAB — PANORAMA PRENATAL TEST FULL PANEL:PANORAMA TEST PLUS 5 ADDITIONAL MICRODELETIONS: FETAL FRACTION: 12.3

## 2023-06-05 LAB — HORIZON CUSTOM: REPORT SUMMARY: NEGATIVE

## 2023-06-25 ENCOUNTER — Ambulatory Visit: Attending: Obstetrics and Gynecology | Admitting: *Deleted

## 2023-06-25 DIAGNOSIS — O99332 Smoking (tobacco) complicating pregnancy, second trimester: Secondary | ICD-10-CM

## 2023-06-25 DIAGNOSIS — F1290 Cannabis use, unspecified, uncomplicated: Secondary | ICD-10-CM

## 2023-06-25 DIAGNOSIS — Z3A19 19 weeks gestation of pregnancy: Secondary | ICD-10-CM

## 2023-06-25 NOTE — Progress Notes (Unsigned)
 Called pt and left message to return call.

## 2023-06-26 NOTE — Progress Notes (Signed)
 New OB Intake  I connected with [name]  on 06/26/23 at 0805 by phone and verified that I am speaking with the correct person using two identifiers. Nurse is located at [facility] and pt is located at home.   I explained I am completing New Patient Intake today. We discussed EDD of 11/19/2023, by Last Menstrual Period. Pt is [id number]. I reviewed her allergies, medications and Medical/Surgical/OB history.    Problem List There are no diagnoses linked to this encounter.  OB History  Gravida Para Term Preterm AB Living  4 2 2  0 1 2  SAB IAB Ectopic Multiple Live Births  1 0 0 0 2    # Outcome Date GA Lbr Len/2nd Weight Sex Type Anes PTL Lv  4 Current           3 Term 02/03/20 [redacted]w[redacted]d   F Vag-Spont  N LIV  2 Term 06/07/17 [redacted]w[redacted]d 17:15 / 02:03 7 lb 11.8 oz (3.51 kg) F Vag-Spont EPI  LIV  1 SAB 2015             Past Medical History:  Diagnosis Date   Anxiety    Asthma    Headache    Labor and delivery indication for care or intervention 06/06/2017   Limited prenatal care in third trimester 03/27/2017   Started care at 26 wks     Supervision of high risk pregnancy, antepartum 03/20/2017            Clinic     [facility]    Prenatal Labs      Dating     unsure LMP --- updated by 3rd trimester u/s    Blood type:   O positive      Genetic Screen    1 Screen:    AFP:     Quad: late to care    NIPS:    Antibody: negative      Anatomic US      female, limited views of heart & spine  [x ] f/u in 4 wks - normal, mostly complete, no additional US  indicated  EDD updated    Rubella:  immune      G   SVD (spontaneous vaginal delivery) 06/07/2017   Please schedule this patient for PP visit in: 4 weeks  Low risk pregnancy complicated by: drug use, limited PNC, incarceration  Delivery mode:  SVD  Anticipated Birth Control:  IUD  PP Procedures needed: none   Schedule Integrated BH visit: no  Provider: either         Past Surgical History:  Procedure Laterality Date   FRACTURE SURGERY      ORTHOPEDIC SURGERY       Current Outpatient Medications on File Prior to Visit  Medication Sig Dispense Refill   acetaminophen (TYLENOL) 500 MG tablet Take 1,000 mg by mouth every 6 (six) hours as needed.     Blood Pressure Monitoring DEVI 1 each by Does not apply route once a week. (Patient not taking: Reported on 05/27/2023) 1 each 0   diphenhydrAMINE (BENADRYL) 25 mg capsule Take 25 mg by mouth every 6 (six) hours as needed for itching or allergies.     nicotine (NICODERM CQ - DOSED IN MG/24 HOURS) 14 mg/24hr patch Place 1 patch (14 mg total) onto the skin daily. 28 patch 0   Prenatal Vit-Fe Fumarate-FA (PREPLUS) 27-1 MG TABS Take 1 tablet by mouth daily. 30 tablet 13   promethazine (PHENERGAN) 12.5 MG tablet Take 1  tablet (12.5 mg total) by mouth every 6 (six) hours as needed for nausea or vomiting. 30 tablet 0   No current facility-administered medications on file prior to visit.      Allergies  Allergen Reactions   Sulfamethoxazole-Trimethoprim Hives, Itching and Nausea And Vomiting   Penicillins Other (See Comments)    Has patient had a PCN reaction causing immediate rash, facial/tongue/throat swelling, SOB or lightheadedness with hypotension: Unknown Has patient had a PCN reaction causing severe rash involving mucus membranes or skin necrosis: Unknown Has patient had a PCN reaction that required hospitalization: Unknown Has patient had a PCN reaction occurring within the last 10 years: Unknown If all of the above answers are "NO", then may proceed with Cephalosporin use.      Patient advised of first appointment in our office and when to arrive.   All questions were answered.

## 2023-06-26 NOTE — Progress Notes (Signed)
 DNKA

## 2023-06-27 ENCOUNTER — Ambulatory Visit: Attending: Obstetrics and Gynecology

## 2023-06-27 ENCOUNTER — Ambulatory Visit: Admitting: Obstetrics and Gynecology

## 2023-06-27 ENCOUNTER — Other Ambulatory Visit: Payer: Self-pay | Admitting: Obstetrics and Gynecology

## 2023-06-27 ENCOUNTER — Other Ambulatory Visit: Payer: Self-pay | Admitting: *Deleted

## 2023-06-27 ENCOUNTER — Ambulatory Visit: Admitting: Advanced Practice Midwife

## 2023-06-27 ENCOUNTER — Ambulatory Visit

## 2023-06-27 VITALS — BP 103/58 | HR 87

## 2023-06-27 DIAGNOSIS — O99332 Smoking (tobacco) complicating pregnancy, second trimester: Secondary | ICD-10-CM | POA: Insufficient documentation

## 2023-06-27 DIAGNOSIS — Z3480 Encounter for supervision of other normal pregnancy, unspecified trimester: Secondary | ICD-10-CM | POA: Diagnosis not present

## 2023-06-27 DIAGNOSIS — F1910 Other psychoactive substance abuse, uncomplicated: Secondary | ICD-10-CM | POA: Insufficient documentation

## 2023-06-27 DIAGNOSIS — O99322 Drug use complicating pregnancy, second trimester: Secondary | ICD-10-CM | POA: Insufficient documentation

## 2023-06-27 DIAGNOSIS — Z3A21 21 weeks gestation of pregnancy: Secondary | ICD-10-CM | POA: Diagnosis not present

## 2023-06-27 DIAGNOSIS — O99320 Drug use complicating pregnancy, unspecified trimester: Secondary | ICD-10-CM | POA: Insufficient documentation

## 2023-06-27 DIAGNOSIS — Z3492 Encounter for supervision of normal pregnancy, unspecified, second trimester: Secondary | ICD-10-CM | POA: Insufficient documentation

## 2023-06-27 DIAGNOSIS — F1290 Cannabis use, unspecified, uncomplicated: Secondary | ICD-10-CM | POA: Diagnosis not present

## 2023-06-27 DIAGNOSIS — Z363 Encounter for antenatal screening for malformations: Secondary | ICD-10-CM | POA: Insufficient documentation

## 2023-06-27 DIAGNOSIS — Z3A19 19 weeks gestation of pregnancy: Secondary | ICD-10-CM

## 2023-06-27 DIAGNOSIS — F17210 Nicotine dependence, cigarettes, uncomplicated: Secondary | ICD-10-CM | POA: Diagnosis not present

## 2023-06-27 NOTE — Progress Notes (Signed)
  Maternal-Fetal Medicine Consultation Name: [name] MRN: [id number]  W0 [id number] at 21-weeks' gestation. Patient is here for fetal anatomy scan. On cell-free fetal DNA screening, the risks of aneuploidies are not increased.  Obstetric history significant for 2 term vaginal deliveries. Patient reports she smokes half a pack cigarettes daily and gives history of marijuana use.  Ultrasound We performed fetal anatomy scan. No makers of aneuploidies or fetal structural defects are seen. Fetal biometry is consistent with 21-weeks' gestation. Amniotic fluid is normal and good fetal activity is seen. Patient understands the limitations of ultrasound in detecting fetal anomalies.  I counseled the patient that cigarette smoking can increase the risk of fetal growth restriction, placental abruption and since the newborn.  I discussed nicotine replacement therapy.  Nicotine patches have only nicotine and does not have other toxic compounds present in the cigarette. Patient reports she is waiting for nicotine patches.  Marijuana is the most-commonly used recreational drug in pregnancy. About 7% to 10% of pregnant women take cannabis. It has been used by some to treat anxiety, depression, nausea and vomiting. A small increase in preterm delivery and neonatal intensive care admissions. No increase in fetal congenital malformations were reported with cannabis.   Fetal brain has THC receptors and long-term effects are not known. Concomitant use of tobacco or other drugs increase the risk of pregnancy adverse outcomes.   Some studies reported increase in stillbirth rates (absolute risk is very small) but that could be because of concomitant use of other recreational drugs or tobacco use. In consistent with ACOG recommendations, cannabis should be avoided in pregnancy. Patient gives history of irregular cycles.  We have amended EDD to 11/07/2023 based on today's ultrasound. Recommendations - Appointment was  made for her to return in 4 weeks for fetal growth assessment.

## 2023-07-01 ENCOUNTER — Encounter: Admitting: Advanced Practice Midwife

## 2023-07-08 ENCOUNTER — Other Ambulatory Visit: Payer: Self-pay

## 2023-07-08 ENCOUNTER — Ambulatory Visit: Admitting: Obstetrics and Gynecology

## 2023-07-08 VITALS — BP 101/55 | HR 79 | Wt 121.0 lb

## 2023-07-08 DIAGNOSIS — Z3A22 22 weeks gestation of pregnancy: Secondary | ICD-10-CM | POA: Diagnosis not present

## 2023-07-08 DIAGNOSIS — O99332 Smoking (tobacco) complicating pregnancy, second trimester: Secondary | ICD-10-CM

## 2023-07-08 DIAGNOSIS — Z3492 Encounter for supervision of normal pregnancy, unspecified, second trimester: Secondary | ICD-10-CM | POA: Diagnosis not present

## 2023-07-08 DIAGNOSIS — O09292 Supervision of pregnancy with other poor reproductive or obstetric history, second trimester: Secondary | ICD-10-CM

## 2023-07-08 DIAGNOSIS — O09299 Supervision of pregnancy with other poor reproductive or obstetric history, unspecified trimester: Secondary | ICD-10-CM

## 2023-07-08 MED ORDER — NICOTINE 14 MG/24HR TD PT24: 14.0000 mg | MEDICATED_PATCH | Freq: Every day | TRANSDERMAL | 4 refills | Status: DC

## 2023-07-08 NOTE — Progress Notes (Signed)
   PRENATAL VISIT NOTE  Subjective:  [name] is a 28 y.o. [phone] at [redacted]w[redacted]d being seen today for ongoing prenatal care.  She is currently monitored for the following issues for this low-risk pregnancy and has History of drug use; Marijuana use; History of postpartum hemorrhage, currently pregnant; Supervision of low-risk pregnancy; and Tobacco smoking affecting pregnancy in second trimester on their problem list.  Patient doing well with no acute concerns today. She reports no complaints.   . Vag. Bleeding: None.  Movement: Present. Denies leaking of fluid.   The following portions of the patient's history were reviewed and updated as appropriate: allergies, current medications, past family history, past medical history, past social history, past surgical history and problem list. Problem list updated.  Objective:   Vitals:   07/08/23 1629  BP: (!) 101/55  Pulse: 79  Weight: 121 lb (54.9 kg)    Fetal Status: Fetal Heart Rate (bpm): 161 Fundal Height: 22 cm Movement: Present     General:  Alert, oriented and cooperative. Patient is in no acute distress.  Skin: Skin is warm and dry. No rash noted.   Cardiovascular: Normal heart rate noted  Respiratory: Normal respiratory effort, no problems with respiration noted  Abdomen: Soft, gravid, appropriate for gestational age.  Pain/Pressure: Absent     Pelvic: Cervical exam deferred        Extremities: Normal range of motion.  Edema: None  Mental Status:  Normal mood and affect. Normal behavior. Normal judgment and thought content.   Assessment and Plan:  Pregnancy: [id number] at [redacted]w[redacted]d  1. Encounter for supervision of low-risk pregnancy in second trimester (Primary) Continue routine prenatal care - AFP, Serum, Open Spina Bifida  2. [redacted] weeks gestation of pregnancy   3. History of postpartum hemorrhage, currently pregnant Consider TXA and uterotonics at time of delivery  4. Tobacco smoking affecting pregnancy in second  trimester Pt requested refill of nicotine  patch, she is down to 1/2 ppd  - nicotine  (NICODERM CQ  - DOSED IN MG/24 HOURS) 14 mg/24hr patch; Place 1 patch (14 mg total) onto the skin daily.  Dispense: 28 patch; Refill: 4  Preterm labor symptoms and general obstetric precautions including but not limited to vaginal bleeding, contractions, leaking of fluid and fetal movement were reviewed in detail with the patient.  Please refer to After Visit Summary for other counseling recommendations.   Return in about 4 weeks (around 08/05/2023) for ROB, in person, 2 hr GTT, 3rd trim labs.   Avie Boeck, MD Faculty Attending Center for [facility]

## 2023-07-10 LAB — AFP, SERUM, OPEN SPINA BIFIDA
AFP MoM: 1.52
AFP Value: 129.8 ng/mL
Gest. Age on Collection Date: 22 wk
Maternal Age At EDD: 28 a
OSBR Risk 1 IN: 2591
Test Results:: NEGATIVE
Weight: 121 [lb_av]

## 2023-07-25 ENCOUNTER — Other Ambulatory Visit: Payer: Self-pay | Admitting: *Deleted

## 2023-07-25 ENCOUNTER — Ambulatory Visit: Attending: Obstetrics and Gynecology | Admitting: Obstetrics

## 2023-07-25 ENCOUNTER — Ambulatory Visit

## 2023-07-25 DIAGNOSIS — O99320 Drug use complicating pregnancy, unspecified trimester: Secondary | ICD-10-CM | POA: Insufficient documentation

## 2023-07-25 DIAGNOSIS — O99322 Drug use complicating pregnancy, second trimester: Secondary | ICD-10-CM

## 2023-07-25 DIAGNOSIS — Z3A25 25 weeks gestation of pregnancy: Secondary | ICD-10-CM | POA: Diagnosis not present

## 2023-07-25 DIAGNOSIS — O99332 Smoking (tobacco) complicating pregnancy, second trimester: Secondary | ICD-10-CM | POA: Diagnosis present

## 2023-07-25 DIAGNOSIS — F1910 Other psychoactive substance abuse, uncomplicated: Secondary | ICD-10-CM

## 2023-07-25 DIAGNOSIS — Z362 Encounter for other antenatal screening follow-up: Secondary | ICD-10-CM | POA: Insufficient documentation

## 2023-07-25 DIAGNOSIS — F1290 Cannabis use, unspecified, uncomplicated: Secondary | ICD-10-CM

## 2023-07-25 DIAGNOSIS — F17210 Nicotine dependence, cigarettes, uncomplicated: Secondary | ICD-10-CM | POA: Diagnosis not present

## 2023-07-25 NOTE — Progress Notes (Signed)
 MFM Consult Note  [name] is currently at 25 weeks and 0 days.  She has been followed due to a history of maternal substance use.  She denies any problems since her last exam.  Her EDC was changed to November 07, 2023 after her last exam.  On today's exam, the overall EFW of 1 pound 15 ounces measures at the 84th percentile for her gestational age.    Her EDC of November 07, 2023 was confirmed by today's exam.    There was normal amniotic fluid noted.  The views of the fetal anatomy visualized today appeared within normal limits.    The limitations of ultrasound in the detection of all anomalies was discussed.    Due to maternal substance use, a follow-up growth scan was scheduled in the third trimester (in 7 weeks).    The patient stated that all of her questions were answered today.  A total of 20 minutes was spent counseling and coordinating the care for this patient.  Greater than 50% of the time was spent in direct face-to-face contact.

## 2023-08-08 ENCOUNTER — Ambulatory Visit: Admitting: Obstetrics and Gynecology

## 2023-08-08 ENCOUNTER — Other Ambulatory Visit: Payer: Self-pay

## 2023-08-08 VITALS — BP 99/62 | HR 87 | Wt 129.0 lb

## 2023-08-08 DIAGNOSIS — O09292 Supervision of pregnancy with other poor reproductive or obstetric history, second trimester: Secondary | ICD-10-CM

## 2023-08-08 DIAGNOSIS — Z3A27 27 weeks gestation of pregnancy: Secondary | ICD-10-CM

## 2023-08-08 DIAGNOSIS — O09299 Supervision of pregnancy with other poor reproductive or obstetric history, unspecified trimester: Secondary | ICD-10-CM

## 2023-08-08 DIAGNOSIS — Z23 Encounter for immunization: Secondary | ICD-10-CM | POA: Diagnosis not present

## 2023-08-08 DIAGNOSIS — O99332 Smoking (tobacco) complicating pregnancy, second trimester: Secondary | ICD-10-CM

## 2023-08-08 DIAGNOSIS — Z3490 Encounter for supervision of normal pregnancy, unspecified, unspecified trimester: Secondary | ICD-10-CM

## 2023-08-08 NOTE — Progress Notes (Signed)
   PRENATAL VISIT NOTE  Subjective:  [name] is a 28 y.o. [phone] at 108w0d being seen today for ongoing prenatal care.  She is currently monitored for the following issues for this low-risk pregnancy and has History of drug use; Marijuana use; History of postpartum hemorrhage, currently pregnant; Supervision of low-risk pregnancy; and Tobacco smoking affecting pregnancy in second trimester on their problem list.  Multiple questions answered regarding BTL as well as LARC.  PT is still deciding.  Patient doing well with no acute concerns today. She reports no complaints.  Contractions: Not present. Vag. Bleeding: None.  Movement: Present. Denies leaking of fluid.   The following portions of the patient's history were reviewed and updated as appropriate: allergies, current medications, past family history, past medical history, past social history, past surgical history and problem list. Problem list updated.  Objective:   Vitals:   08/08/23 1540  BP: 99/62  Pulse: 87  Weight: 129 lb (58.5 kg)    Fetal Status: Fetal Heart Rate (bpm): 152 Fundal Height: 26 cm Movement: Present     General:  Alert, oriented and cooperative. Patient is in no acute distress.  Skin: Skin is warm and dry. No rash noted.   Cardiovascular: Normal heart rate noted  Respiratory: Normal respiratory effort, no problems with respiration noted  Abdomen: Soft, gravid, appropriate for gestational age.  Pain/Pressure: Absent     Pelvic: Cervical exam deferred        Extremities: Normal range of motion.  Edema: None  Mental Status:  Normal mood and affect. Normal behavior. Normal judgment and thought content.   Assessment and Plan:  Pregnancy: [id number] at [redacted]w[redacted]d  1. Encounter for supervision of low-risk pregnancy, antepartum (Primary) Continue routine prenatal care Pt to reschedule for lab only 2 hour GTT and testing - Tdap vaccine greater than or equal to 7yo IM  2. [redacted] weeks gestation of pregnancy  -  Tdap vaccine greater than or equal to 7yo IM - CBC; Future - Glucose Tolerance, 2 Hours w/1 Hour; Future - RPR; Future - HIV Antibody (routine testing w rflx); Future  3. Tobacco smoking affecting pregnancy in second trimester Pt continues with nicotine  patch  4. History of postpartum hemorrhage, currently pregnant TXA at time of delivery  Preterm labor symptoms and general obstetric precautions including but not limited to vaginal bleeding, contractions, leaking of fluid and fetal movement were reviewed in detail with the patient.  Please refer to After Visit Summary for other counseling recommendations.   Return in about 2 weeks (around 08/22/2023) for ROB, in person.   Avie Boeck, MD Faculty Attending Center for [facility]

## 2023-08-12 ENCOUNTER — Other Ambulatory Visit

## 2023-08-12 ENCOUNTER — Other Ambulatory Visit: Payer: Self-pay

## 2023-08-12 DIAGNOSIS — Z3492 Encounter for supervision of normal pregnancy, unspecified, second trimester: Secondary | ICD-10-CM | POA: Diagnosis not present

## 2023-08-12 DIAGNOSIS — Z3A27 27 weeks gestation of pregnancy: Secondary | ICD-10-CM

## 2023-08-13 ENCOUNTER — Ambulatory Visit: Payer: Self-pay | Admitting: Obstetrics and Gynecology

## 2023-08-13 LAB — CBC
Hematocrit: 34.5 % (ref 34.0–46.6)
Hemoglobin: 11.4 g/dL (ref 11.1–15.9)
MCH: 30.6 pg (ref 26.6–33.0)
MCHC: 33 g/dL (ref 31.5–35.7)
MCV: 93 fL (ref 79–97)
Platelets: 178 10*3/uL (ref 150–450)
RBC: 3.73 x10E6/uL — ABNORMAL LOW (ref 3.77–5.28)
RDW: 13.1 % (ref 11.7–15.4)
WBC: 14.1 10*3/uL — ABNORMAL HIGH (ref 3.4–10.8)

## 2023-08-13 LAB — RPR: RPR Ser Ql: NONREACTIVE

## 2023-08-13 LAB — GLUCOSE TOLERANCE, 2 HOURS W/ 1HR
Glucose, 1 hour: 72 mg/dL (ref 70–179)
Glucose, 2 hour: 73 mg/dL (ref 70–152)
Glucose, Fasting: 72 mg/dL (ref 70–91)

## 2023-08-13 LAB — HIV ANTIBODY (ROUTINE TESTING W REFLEX): HIV Screen 4th Generation wRfx: NONREACTIVE

## 2023-09-03 ENCOUNTER — Encounter: Admitting: Obstetrics and Gynecology

## 2023-09-05 ENCOUNTER — Encounter: Admitting: Obstetrics and Gynecology

## 2023-09-12 ENCOUNTER — Ambulatory Visit (HOSPITAL_BASED_OUTPATIENT_CLINIC_OR_DEPARTMENT_OTHER)

## 2023-09-12 ENCOUNTER — Ambulatory Visit: Attending: Obstetrics | Admitting: Obstetrics and Gynecology

## 2023-09-12 VITALS — BP 104/61 | HR 77

## 2023-09-12 DIAGNOSIS — Z362 Encounter for other antenatal screening follow-up: Secondary | ICD-10-CM | POA: Diagnosis not present

## 2023-09-12 DIAGNOSIS — O99332 Smoking (tobacco) complicating pregnancy, second trimester: Secondary | ICD-10-CM | POA: Diagnosis not present

## 2023-09-12 DIAGNOSIS — F17210 Nicotine dependence, cigarettes, uncomplicated: Secondary | ICD-10-CM | POA: Diagnosis not present

## 2023-09-12 DIAGNOSIS — Z3A32 32 weeks gestation of pregnancy: Secondary | ICD-10-CM | POA: Diagnosis not present

## 2023-09-12 DIAGNOSIS — O99323 Drug use complicating pregnancy, third trimester: Secondary | ICD-10-CM

## 2023-09-12 DIAGNOSIS — F1290 Cannabis use, unspecified, uncomplicated: Secondary | ICD-10-CM

## 2023-09-12 DIAGNOSIS — F1910 Other psychoactive substance abuse, uncomplicated: Secondary | ICD-10-CM | POA: Diagnosis not present

## 2023-09-12 DIAGNOSIS — O99333 Smoking (tobacco) complicating pregnancy, third trimester: Secondary | ICD-10-CM | POA: Diagnosis not present

## 2023-09-12 DIAGNOSIS — F1991 Other psychoactive substance use, unspecified, in remission: Secondary | ICD-10-CM

## 2023-09-12 DIAGNOSIS — Z3493 Encounter for supervision of normal pregnancy, unspecified, third trimester: Secondary | ICD-10-CM

## 2023-09-12 NOTE — Progress Notes (Signed)
 Maternal-Fetal Medicine Consultation Name: [name] MRN: [id number]  G4 E7987 at 32w gestation.  Patient is here for fetal growth assessment. She does not have gestational diabetes.  Blood pressure today at our office is 104/61 mmHg.  She reports she is still smokes cigarettes. Ultrasound Amniotic fluid normal good fetal activity seen.  Fetal growth is appropriate for gestational age.  Cephalic presentation.  I discussed smoking cigarettes in pregnancy and possible complications including growth restriction and placental abruption.  I discussed nicotine  replacement therapy.  Recommendations - Follow-up scans as clinically indicated.  Consultation including face-to-face (more than 50%) counseling 10 minutes.

## 2023-10-01 ENCOUNTER — Other Ambulatory Visit: Payer: Self-pay

## 2023-10-01 ENCOUNTER — Encounter: Payer: Self-pay | Admitting: Obstetrics and Gynecology

## 2023-10-01 ENCOUNTER — Ambulatory Visit (INDEPENDENT_AMBULATORY_CARE_PROVIDER_SITE_OTHER): Admitting: Obstetrics and Gynecology

## 2023-10-01 VITALS — BP 109/64 | HR 88 | Wt 138.4 lb

## 2023-10-01 DIAGNOSIS — Z3A34 34 weeks gestation of pregnancy: Secondary | ICD-10-CM

## 2023-10-01 DIAGNOSIS — F1991 Other psychoactive substance use, unspecified, in remission: Secondary | ICD-10-CM | POA: Diagnosis not present

## 2023-10-01 DIAGNOSIS — O99333 Smoking (tobacco) complicating pregnancy, third trimester: Secondary | ICD-10-CM

## 2023-10-01 DIAGNOSIS — Z3493 Encounter for supervision of normal pregnancy, unspecified, third trimester: Secondary | ICD-10-CM

## 2023-10-01 DIAGNOSIS — O99332 Smoking (tobacco) complicating pregnancy, second trimester: Secondary | ICD-10-CM

## 2023-10-01 MED ORDER — PROMETHAZINE HCL 12.5 MG PO TABS: 12.5000 mg | ORAL_TABLET | Freq: Four times a day (QID) | ORAL | 0 refills | Status: DC | PRN

## 2023-10-01 NOTE — Progress Notes (Signed)
   PRENATAL VISIT NOTE  Subjective:  [name] is a 28 y.o. [phone] at [redacted]w[redacted]d being seen today for ongoing prenatal care.  She is currently monitored for the following issues for this low-risk pregnancy and has History of drug use; Marijuana use; History of postpartum hemorrhage, currently pregnant; Supervision of low-risk pregnancy; and Tobacco smoking affecting pregnancy in second trimester on their problem list.  Patient reports having a hard time sleeping. Doing well with phenergan .  Contractions: Not present. Vag. Bleeding: None.  Movement: Present. Denies leaking of fluid.   The following portions of the patient's history were reviewed and updated as appropriate: allergies, current medications, past family history, past medical history, past social history, past surgical history and problem list.   Objective:    Vitals:   10/01/23 1608  BP: 109/64  Pulse: 88  Weight: 138 lb 6.4 oz (62.8 kg)    Fetal Status:  Fetal Heart Rate (bpm): 146   Movement: Present    General: Alert, oriented and cooperative. Patient is in no acute distress.  Skin: Skin is warm and dry. No rash noted.   Cardiovascular: Normal heart rate noted  Respiratory: Normal respiratory effort, no problems with respiration noted  Abdomen: Soft, gravid, appropriate for gestational age.  Pain/Pressure: Absent     Pelvic: Cervical exam deferred        Extremities: Normal range of motion.  Edema: None  Mental Status: Normal mood and affect. Normal behavior. Normal judgment and thought content.   Assessment and Plan:  Pregnancy: [id number] at [redacted]w[redacted]d  1. Encounter for supervision of low-risk pregnancy in third trimester (Primary) - Doing well, still having nausea but doing okay with phenergan  - does not want BTL  2. History of drug use No use during pregnancy  3. [redacted] weeks gestation of pregnancy  4. Tobacco smoking affecting pregnancy in second trimester Patches previously sent  Preterm labor symptoms and  general obstetric precautions including but not limited to vaginal bleeding, contractions, leaking of fluid and fetal movement were reviewed in detail with the patient. Please refer to After Visit Summary for other counseling recommendations.   Return in about 2 weeks (around 10/15/2023) for low OB, 36 week swabs.  No future appointments.  Burnard CHRISTELLA Moats, MD

## 2023-10-14 ENCOUNTER — Other Ambulatory Visit: Payer: Self-pay

## 2023-10-14 ENCOUNTER — Telehealth: Payer: Self-pay

## 2023-10-14 ENCOUNTER — Other Ambulatory Visit (HOSPITAL_COMMUNITY)
Admission: RE | Admit: 2023-10-14 | Discharge: 2023-10-14 | Disposition: A | Discharge status: Home / Self Care | Source: Ambulatory Visit | Attending: Obstetrics and Gynecology | Admitting: Obstetrics and Gynecology

## 2023-10-14 ENCOUNTER — Ambulatory Visit: Admitting: Obstetrics and Gynecology

## 2023-10-14 VITALS — BP 112/66 | HR 81 | Wt 137.0 lb

## 2023-10-14 DIAGNOSIS — Z3493 Encounter for supervision of normal pregnancy, unspecified, third trimester: Secondary | ICD-10-CM | POA: Diagnosis present

## 2023-10-14 DIAGNOSIS — O99332 Smoking (tobacco) complicating pregnancy, second trimester: Secondary | ICD-10-CM

## 2023-10-14 DIAGNOSIS — O09299 Supervision of pregnancy with other poor reproductive or obstetric history, unspecified trimester: Secondary | ICD-10-CM

## 2023-10-14 DIAGNOSIS — O99333 Smoking (tobacco) complicating pregnancy, third trimester: Secondary | ICD-10-CM

## 2023-10-14 DIAGNOSIS — O09293 Supervision of pregnancy with other poor reproductive or obstetric history, third trimester: Secondary | ICD-10-CM | POA: Diagnosis not present

## 2023-10-14 DIAGNOSIS — Z3A36 36 weeks gestation of pregnancy: Secondary | ICD-10-CM

## 2023-10-14 DIAGNOSIS — O26843 Uterine size-date discrepancy, third trimester: Secondary | ICD-10-CM

## 2023-10-14 DIAGNOSIS — F1991 Other psychoactive substance use, unspecified, in remission: Secondary | ICD-10-CM

## 2023-10-14 NOTE — Telephone Encounter (Signed)
 Called patient to notify of f/u US  scheduled for 10/22/23 at 1115 with MFM. Patient confirmed scheduled appointment.   Rosaline, RN

## 2023-10-14 NOTE — Progress Notes (Signed)
   PRENATAL VISIT NOTE  Subjective:  [name] is a 28 y.o. [phone] at [redacted]w[redacted]d being seen today for ongoing prenatal care.  She is currently monitored for the following issues for this low-risk pregnancy and has History of drug use; Marijuana use; History of postpartum hemorrhage, currently pregnant; Supervision of low-risk pregnancy; and Tobacco smoking affecting pregnancy in second trimester on their problem list.  Patient reports doing well overall.  Contractions: Not present. Vag. Bleeding: None.  Movement: Present. Denies leaking of fluid.   The following portions of the patient's history were reviewed and updated as appropriate: allergies, current medications, past family history, past medical history, past social history, past surgical history and problem list.   Objective:   Vitals:   10/14/23 1332  BP: 112/66  Pulse: 81  Weight: 137 lb (62.1 kg)    Fetal Status: Fetal Heart Rate (bpm): 135 Fundal Height: 33 cm Movement: Present     General:  Alert, oriented and cooperative. Patient is in no acute distress.  Skin: Skin is warm and dry. No rash noted.   Cardiovascular: Normal heart rate noted  Respiratory: Normal respiratory effort, no problems with respiration noted  Abdomen: Soft, gravid, appropriate for gestational age.  Pain/Pressure: Present (pain pevic/ vagina area)      Assessment and Plan:  Pregnancy: [id number] at [redacted]w[redacted]d 1. Encounter for supervision of low-risk pregnancy in third trimester (Primary) 2. [redacted] weeks gestation of pregnancy - Cervicovaginal ancillary only - Strep Gp B Culture+Rflx [facility] for pediatrician Considering Depo for PPBC  3. Tobacco smoking affecting pregnancy in second trimester 1/2ppd, nicotine  patches during the day - US  MFM OB FOLLOW UP; Future  4. Fundal height low for dates in third trimester - US  MFM OB FOLLOW UP; Future  5. History of postpartum hemorrhage, currently pregnant Hgb 11.4  Please refer to After Visit  Summary for other counseling recommendations.   Return in about 1 week (around 10/21/2023) for return OB at 37 weeks.  No future appointments.  Kieth JAYSON Carolin, MD

## 2023-10-15 ENCOUNTER — Ambulatory Visit: Payer: Self-pay | Admitting: Obstetrics and Gynecology

## 2023-10-15 DIAGNOSIS — Z3493 Encounter for supervision of normal pregnancy, unspecified, third trimester: Secondary | ICD-10-CM

## 2023-10-15 LAB — CERVICOVAGINAL ANCILLARY ONLY
Chlamydia: NEGATIVE
Comment: NEGATIVE
Comment: NORMAL
Neisseria Gonorrhea: NEGATIVE

## 2023-10-20 LAB — STREP GP B CULTURE+RFLX: Strep Gp B Culture+Rflx: POSITIVE — AB

## 2023-10-20 LAB — STREP GP B SUSCEPTIBILITY

## 2023-10-22 ENCOUNTER — Ambulatory Visit

## 2023-10-24 ENCOUNTER — Ambulatory Visit: Admitting: Certified Nurse Midwife

## 2023-10-24 ENCOUNTER — Other Ambulatory Visit: Payer: Self-pay

## 2023-10-24 VITALS — BP 115/74 | HR 106 | Wt 143.4 lb

## 2023-10-24 DIAGNOSIS — Z3493 Encounter for supervision of normal pregnancy, unspecified, third trimester: Secondary | ICD-10-CM

## 2023-10-24 DIAGNOSIS — O26893 Other specified pregnancy related conditions, third trimester: Secondary | ICD-10-CM | POA: Diagnosis not present

## 2023-10-24 DIAGNOSIS — O09293 Supervision of pregnancy with other poor reproductive or obstetric history, third trimester: Secondary | ICD-10-CM | POA: Diagnosis not present

## 2023-10-24 DIAGNOSIS — O09299 Supervision of pregnancy with other poor reproductive or obstetric history, unspecified trimester: Secondary | ICD-10-CM

## 2023-10-24 DIAGNOSIS — R12 Heartburn: Secondary | ICD-10-CM | POA: Diagnosis not present

## 2023-10-24 DIAGNOSIS — O99332 Smoking (tobacco) complicating pregnancy, second trimester: Secondary | ICD-10-CM

## 2023-10-24 DIAGNOSIS — O99333 Smoking (tobacco) complicating pregnancy, third trimester: Secondary | ICD-10-CM

## 2023-10-24 DIAGNOSIS — Z3A38 38 weeks gestation of pregnancy: Secondary | ICD-10-CM

## 2023-10-24 MED ORDER — PANTOPRAZOLE SODIUM 20 MG PO TBEC
20.0000 mg | DELAYED_RELEASE_TABLET | Freq: Every day | ORAL | 1 refills | Status: DC
Start: 2023-10-24 — End: 2023-11-16

## 2023-10-24 NOTE — Progress Notes (Addendum)
   PRENATAL VISIT NOTE  Subjective:  [name] is a 28 y.o. [phone] at [redacted]w[redacted]d being seen today for ongoing prenatal care.  She is currently monitored for the following issues for this low-risk pregnancy and has History of drug use; Marijuana use; History of postpartum hemorrhage, currently pregnant; Supervision of low-risk pregnancy; and Tobacco smoking affecting pregnancy in second trimester on their problem list.  Patient reports heartburn.  Contractions: Not present. Vag. Bleeding: None.  Movement: Present. Denies leaking of fluid.   The following portions of the patient's history were reviewed and updated as appropriate: allergies, current medications, past family history, past medical history, past social history, past surgical history and problem list.   Objective:    Vitals:   10/24/23 1136  BP: 115/74  Pulse: (!) 106  Weight: 143 lb 6.4 oz (65 kg)    Fetal Status:  Fetal Heart Rate (bpm): 140 Fundal Height: 34 cm Movement: Present    General: Alert, oriented and cooperative. Patient is in no acute distress.  Skin: Skin is warm and dry. No rash noted.   Cardiovascular: Normal heart rate noted  Respiratory: Normal respiratory effort, no problems with respiration noted  Abdomen: Soft, gravid, appropriate for gestational age.  Pain/Pressure: Present (Hip pain)     Pelvic: Cervical exam deferred        Extremities: Normal range of motion.  Edema: Trace  Mental Status: Normal mood and affect. Normal behavior. Normal judgment and thought content.   Assessment and Plan:  Pregnancy: [id number] at [redacted]w[redacted]d 1. Encounter for supervision of low-risk pregnancy in third trimester (Primary) Doing well. BP and FHR normal. Vigorous fetal movement.  2. [redacted] weeks gestation of pregnancy FH measuring 34 cm. Follow up u/s 10/29/23 for growth.  3. History of postpartum hemorrhage, currently pregnant   4. Heartburn during pregnancy in third trimester Tums are not working. Rx Protonix   5.  Tobacco smoking affecting pregnancy in second trimester Using Nicoderm CQ  patch. Smoking 5-10 cigs/ per day    Term labor symptoms and general obstetric precautions including but not limited to vaginal bleeding, contractions, leaking of fluid and fetal movement were reviewed in detail with the patient. Please refer to After Visit Summary for other counseling recommendations.   Return in about 1 week (around 10/31/2023) for ROB.  Future Appointments  Date Time Provider Department Center  10/29/2023  3:30 PM [facility] PROVIDER 1 [facility]  10/29/2023  3:45 PM [facility] US4 [facility]    Aleeyah Bensen JINNY Freund, NP Student

## 2023-10-29 ENCOUNTER — Other Ambulatory Visit: Payer: Self-pay | Admitting: Obstetrics and Gynecology

## 2023-10-29 ENCOUNTER — Ambulatory Visit: Attending: Obstetrics and Gynecology | Admitting: Obstetrics and Gynecology

## 2023-10-29 ENCOUNTER — Ambulatory Visit (HOSPITAL_BASED_OUTPATIENT_CLINIC_OR_DEPARTMENT_OTHER)

## 2023-10-29 VITALS — BP 119/70 | HR 96

## 2023-10-29 DIAGNOSIS — O26843 Uterine size-date discrepancy, third trimester: Secondary | ICD-10-CM | POA: Diagnosis not present

## 2023-10-29 DIAGNOSIS — O99333 Smoking (tobacco) complicating pregnancy, third trimester: Secondary | ICD-10-CM

## 2023-10-29 DIAGNOSIS — Z3A36 36 weeks gestation of pregnancy: Secondary | ICD-10-CM

## 2023-10-29 DIAGNOSIS — Z3A38 38 weeks gestation of pregnancy: Secondary | ICD-10-CM

## 2023-10-29 DIAGNOSIS — F1910 Other psychoactive substance abuse, uncomplicated: Secondary | ICD-10-CM

## 2023-10-29 DIAGNOSIS — O99332 Smoking (tobacco) complicating pregnancy, second trimester: Secondary | ICD-10-CM

## 2023-10-29 DIAGNOSIS — Z362 Encounter for other antenatal screening follow-up: Secondary | ICD-10-CM | POA: Diagnosis present

## 2023-10-29 DIAGNOSIS — F1991 Other psychoactive substance use, unspecified, in remission: Secondary | ICD-10-CM

## 2023-10-29 DIAGNOSIS — Z3493 Encounter for supervision of normal pregnancy, unspecified, third trimester: Secondary | ICD-10-CM | POA: Diagnosis not present

## 2023-10-29 DIAGNOSIS — O99323 Drug use complicating pregnancy, third trimester: Secondary | ICD-10-CM | POA: Diagnosis not present

## 2023-10-29 DIAGNOSIS — F17290 Nicotine dependence, other tobacco product, uncomplicated: Secondary | ICD-10-CM

## 2023-10-29 DIAGNOSIS — O09299 Supervision of pregnancy with other poor reproductive or obstetric history, unspecified trimester: Secondary | ICD-10-CM

## 2023-10-29 NOTE — Progress Notes (Signed)
 After review, MFM consult with provider is not indicated for today  Arna Ranks, MD 10/29/2023 4:35 PM  [facility]

## 2023-10-29 NOTE — Progress Notes (Unsigned)
   PRENATAL VISIT NOTE  Subjective:  [name] is a 28 y.o. [phone] at [redacted]w[redacted]d being seen today for ongoing prenatal care.  She is currently monitored for the following issues for this low-risk pregnancy and has History of drug use; Marijuana use; History of postpartum hemorrhage, currently pregnant; Supervision of low-risk pregnancy; and Tobacco smoking affecting pregnancy in third trimester on their problem list.  Patient reports {sx:14538}.   .  .   . Denies leaking of fluid.   The following portions of the patient's history were reviewed and updated as appropriate: allergies, current medications, past family history, past medical history, past social history, past surgical history and problem list.   Objective:    There were no vitals filed for this visit.  Fetal Status:           General: Alert, oriented and cooperative. Patient is in no acute distress.  Skin: Skin is warm and dry. No rash noted.   Cardiovascular: Normal heart rate noted  Respiratory: Normal respiratory effort, no problems with respiration noted  Abdomen: Soft, gravid, appropriate for gestational age.        Pelvic: {Blank single:19197::Cervical exam performed in the presence of a chaperone,Cervical exam deferred}        Extremities: Normal range of motion.     Mental Status: Normal mood and affect. Normal behavior. Normal judgment and thought content.   Assessment and Plan:  Pregnancy: [id number] at [redacted]w[redacted]d 1. Encounter for supervision of low-risk pregnancy in third trimester (Primary) GBS pos with PCN allergy - reaction unknown. Resistant to Clinda.  Still smoking - Had growth on 8/19 - ***  2. History of postpartum hemorrhage, currently pregnant Ebl was 2500cc in 2019. Will have all medications available at delivery.   3. Pregnancy with 38 completed weeks gestation ***  Term labor symptoms and general obstetric precautions including but not limited to vaginal bleeding, contractions, leaking of fluid  and fetal movement were reviewed in detail with the patient. Please refer to After Visit Summary for other counseling recommendations.   No follow-ups on file.  Future Appointments  Date Time Provider Department Center  10/29/2023  3:30 PM [facility] PROVIDER 1 [facility]  10/29/2023  3:45 PM [facility] US4 [facility]  10/30/2023  2:35 PM Cleatus Moccasin, MD [facility]    Moccasin Cleatus, MD

## 2023-10-30 ENCOUNTER — Ambulatory Visit (INDEPENDENT_AMBULATORY_CARE_PROVIDER_SITE_OTHER): Admitting: Obstetrics and Gynecology

## 2023-10-30 ENCOUNTER — Other Ambulatory Visit: Payer: Self-pay

## 2023-10-30 VITALS — BP 131/81 | HR 93 | Wt 146.9 lb

## 2023-10-30 DIAGNOSIS — O09299 Supervision of pregnancy with other poor reproductive or obstetric history, unspecified trimester: Secondary | ICD-10-CM

## 2023-10-30 DIAGNOSIS — O09293 Supervision of pregnancy with other poor reproductive or obstetric history, third trimester: Secondary | ICD-10-CM

## 2023-10-30 DIAGNOSIS — Z3493 Encounter for supervision of normal pregnancy, unspecified, third trimester: Secondary | ICD-10-CM

## 2023-10-30 DIAGNOSIS — Z3A38 38 weeks gestation of pregnancy: Secondary | ICD-10-CM

## 2023-11-07 ENCOUNTER — Other Ambulatory Visit: Payer: Self-pay

## 2023-11-07 ENCOUNTER — Encounter: Payer: Self-pay | Admitting: Family Medicine

## 2023-11-07 ENCOUNTER — Ambulatory Visit (INDEPENDENT_AMBULATORY_CARE_PROVIDER_SITE_OTHER): Admitting: Family Medicine

## 2023-11-07 VITALS — BP 131/81 | HR 93 | Wt 147.0 lb

## 2023-11-07 DIAGNOSIS — Z3A40 40 weeks gestation of pregnancy: Secondary | ICD-10-CM | POA: Diagnosis not present

## 2023-11-07 DIAGNOSIS — O09293 Supervision of pregnancy with other poor reproductive or obstetric history, third trimester: Secondary | ICD-10-CM | POA: Diagnosis not present

## 2023-11-07 DIAGNOSIS — Z3493 Encounter for supervision of normal pregnancy, unspecified, third trimester: Secondary | ICD-10-CM

## 2023-11-07 DIAGNOSIS — O09299 Supervision of pregnancy with other poor reproductive or obstetric history, unspecified trimester: Secondary | ICD-10-CM

## 2023-11-07 NOTE — Progress Notes (Signed)
   Subjective:  [name] is a 28 y.o. [phone] at [redacted]w[redacted]d being seen today for ongoing prenatal care.  She is currently monitored for the following issues for this high-risk pregnancy and has History of drug use; Marijuana use; History of postpartum hemorrhage, currently pregnant; Supervision of low-risk pregnancy; and Tobacco smoking affecting pregnancy in third trimester on their problem list.  Patient reports no complaints.  Contractions: Irritability. Vag. Bleeding: None.  Movement: Present. Denies leaking of fluid.   The following portions of the patient's history were reviewed and updated as appropriate: allergies, current medications, past family history, past medical history, past social history, past surgical history and problem list. Problem list updated.  Objective:   Vitals:   11/07/23 1136  BP: 131/81  Pulse: 93  Weight: 147 lb (66.7 kg)    Fetal Status: Fetal Heart Rate (bpm): 140   Movement: Present  Presentation: Vertex (confirmed on bedside US  as not able to confirm on cervical exam)  General:  Alert, oriented and cooperative. Patient is in no acute distress.  Skin: Skin is warm and dry. No rash noted.   Cardiovascular: Normal heart rate noted  Respiratory: Normal respiratory effort, no problems with respiration noted  Abdomen: Soft, gravid, appropriate for gestational age. Pain/Pressure: Present     Pelvic: Vag. Bleeding: None     Cervical exam performed Dilation: 1 Effacement (%): Thick Station: Ballotable  Extremities: Normal range of motion.  Edema: Trace  Mental Status: Normal mood and affect. Normal behavior. Normal judgment and thought content.   Urinalysis:      Assessment and Plan:  Pregnancy: [id number] at [redacted]w[redacted]d  1. Encounter for supervision of low-risk pregnancy in third trimester (Primary) BP and FHR normal Patient very not excited about IOL. Discussed at length, she reports length IOL with both prior pregnancies, including a 3 day IOL for her last  pregnancy (this was in [place] ) That being said, she is also getting tired of being pregnant Discussed that general recommendation for our group is IOL around [redacted] weeks along with antenatal testing after 40 weeks After discussion she agreed to return next week on Tuesday at [redacted]w[redacted]d for NST and membrane sweep (no other testing slots available prior), and then if not delivered to take next available IOL after 41 weeks Scheduled for IOL on 11/16/2023 at [redacted]w[redacted]d Form faxed, orders placed  2. History of postpartum hemorrhage, currently pregnant Will need prophylactic measures at time of delivery, >2L loss with first pregnancy  Term labor symptoms and general obstetric precautions including but not limited to vaginal bleeding, contractions, leaking of fluid and fetal movement were reviewed in detail with the patient. Please refer to After Visit Summary for other counseling recommendations.  Return in about 1 week (around 11/14/2023) for [facility], ob visit.   Lola Donnice HERO, MD

## 2023-11-07 NOTE — Patient Instructions (Signed)

## 2023-11-12 ENCOUNTER — Ambulatory Visit: Admitting: Obstetrics & Gynecology

## 2023-11-12 ENCOUNTER — Encounter (HOSPITAL_COMMUNITY): Payer: Self-pay | Admitting: *Deleted

## 2023-11-12 ENCOUNTER — Telehealth (HOSPITAL_COMMUNITY): Payer: Self-pay | Admitting: *Deleted

## 2023-11-12 ENCOUNTER — Other Ambulatory Visit: Payer: Self-pay

## 2023-11-12 ENCOUNTER — Encounter: Payer: Self-pay | Admitting: Obstetrics & Gynecology

## 2023-11-12 VITALS — BP 113/77 | HR 100 | Wt 145.0 lb

## 2023-11-12 DIAGNOSIS — Z3493 Encounter for supervision of normal pregnancy, unspecified, third trimester: Secondary | ICD-10-CM

## 2023-11-12 DIAGNOSIS — Z3A40 40 weeks gestation of pregnancy: Secondary | ICD-10-CM

## 2023-11-12 DIAGNOSIS — F1991 Other psychoactive substance use, unspecified, in remission: Secondary | ICD-10-CM

## 2023-11-12 DIAGNOSIS — O09293 Supervision of pregnancy with other poor reproductive or obstetric history, third trimester: Secondary | ICD-10-CM | POA: Diagnosis not present

## 2023-11-12 DIAGNOSIS — O09299 Supervision of pregnancy with other poor reproductive or obstetric history, unspecified trimester: Secondary | ICD-10-CM

## 2023-11-12 NOTE — Telephone Encounter (Signed)
 Preadmission screen

## 2023-11-12 NOTE — Progress Notes (Signed)
   PRENATAL VISIT NOTE  Subjective:  [name] is a 28 y.o. [phone] at [redacted]w[redacted]d being seen today for ongoing prenatal care.  She is currently monitored for the following issues for this high-risk pregnancy and has History of drug use; Marijuana use; History of postpartum hemorrhage, currently pregnant; Supervision of low-risk pregnancy; and Tobacco smoking affecting pregnancy in third trimester on their problem list.  Patient reports no complaints.  Contractions: Not present. Vag. Bleeding: None.  Movement: Present. Denies leaking of fluid.   The following portions of the patient's history were reviewed and updated as appropriate: allergies, current medications, past family history, past medical history, past social history, past surgical history and problem list.   Objective:    Vitals:   11/12/23 1320  BP: 113/77  Pulse: 100  Weight: 145 lb (65.8 kg)    Fetal Status:  Fetal Heart Rate (bpm): 134   Movement: Present    General: Alert, oriented and cooperative. Patient is in no acute distress.  Skin: Skin is warm and dry. No rash noted.   Cardiovascular: Normal heart rate noted  Respiratory: Normal respiratory effort, no problems with respiration noted  Abdomen: Soft, gravid, appropriate for gestational age.  Pain/Pressure: Present     Pelvic: Cervical exam performed in the presence of a chaperone        Extremities: Normal range of motion.  Edema: None  Mental Status: Normal mood and affect. Normal behavior. Normal judgment and thought content.   Assessment and Plan:  Pregnancy: [id number] at [redacted]w[redacted]d 1. History of drug use (Primary)   2. History of postpartum hemorrhage, currently pregnant   3. Encounter for supervision of low-risk pregnancy in third trimester [redacted]w[redacted]d, schedule 41 week IOL  Term labor symptoms and general obstetric precautions including but not limited to vaginal bleeding, contractions, leaking of fluid and fetal movement were reviewed in detail with the  patient. Please refer to After Visit Summary for other counseling recommendations.   Return if symptoms worsen or fail to improve.  Future Appointments  Date Time Provider Department Center  11/15/2023 12:00 AM [facility] SCHED ROOM [facility] None    Lynwood Solomons, MD

## 2023-11-14 ENCOUNTER — Other Ambulatory Visit: Payer: Self-pay

## 2023-11-14 ENCOUNTER — Encounter (HOSPITAL_COMMUNITY): Payer: Self-pay | Admitting: Obstetrics and Gynecology

## 2023-11-14 ENCOUNTER — Inpatient Hospital Stay (HOSPITAL_COMMUNITY): Admitting: Anesthesiology

## 2023-11-14 ENCOUNTER — Inpatient Hospital Stay (EMERGENCY_DEPARTMENT_HOSPITAL)
Admission: AD | Admit: 2023-11-14 | Discharge: 2023-11-14 | Disposition: A | Discharge status: Home / Self Care | Source: Home / Self Care | Attending: Obstetrics and Gynecology | Admitting: Obstetrics and Gynecology

## 2023-11-14 ENCOUNTER — Encounter (HOSPITAL_COMMUNITY): Payer: Self-pay | Admitting: Obstetrics & Gynecology

## 2023-11-14 ENCOUNTER — Inpatient Hospital Stay (HOSPITAL_COMMUNITY)
Admission: AD | Admit: 2023-11-14 | Discharge: 2023-11-16 | DRG: 806 | Disposition: A | Discharge status: Home / Self Care | Attending: Obstetrics and Gynecology | Admitting: Obstetrics and Gynecology

## 2023-11-14 DIAGNOSIS — Z3A41 41 weeks gestation of pregnancy: Secondary | ICD-10-CM

## 2023-11-14 DIAGNOSIS — O99324 Drug use complicating childbirth: Secondary | ICD-10-CM | POA: Diagnosis present

## 2023-11-14 DIAGNOSIS — F1290 Cannabis use, unspecified, uncomplicated: Secondary | ICD-10-CM | POA: Diagnosis present

## 2023-11-14 DIAGNOSIS — O471 False labor at or after 37 completed weeks of gestation: Secondary | ICD-10-CM | POA: Insufficient documentation

## 2023-11-14 DIAGNOSIS — O99820 Streptococcus B carrier state complicating pregnancy: Secondary | ICD-10-CM | POA: Diagnosis not present

## 2023-11-14 DIAGNOSIS — O99824 Streptococcus B carrier state complicating childbirth: Secondary | ICD-10-CM | POA: Diagnosis present

## 2023-11-14 DIAGNOSIS — O9962 Diseases of the digestive system complicating childbirth: Secondary | ICD-10-CM | POA: Diagnosis present

## 2023-11-14 DIAGNOSIS — O26893 Other specified pregnancy related conditions, third trimester: Secondary | ICD-10-CM | POA: Diagnosis present

## 2023-11-14 DIAGNOSIS — Z8249 Family history of ischemic heart disease and other diseases of the circulatory system: Secondary | ICD-10-CM

## 2023-11-14 DIAGNOSIS — K219 Gastro-esophageal reflux disease without esophagitis: Secondary | ICD-10-CM | POA: Diagnosis present

## 2023-11-14 DIAGNOSIS — F17210 Nicotine dependence, cigarettes, uncomplicated: Secondary | ICD-10-CM | POA: Diagnosis present

## 2023-11-14 DIAGNOSIS — O480 Post-term pregnancy: Secondary | ICD-10-CM | POA: Diagnosis not present

## 2023-11-14 DIAGNOSIS — O99334 Smoking (tobacco) complicating childbirth: Secondary | ICD-10-CM | POA: Diagnosis not present

## 2023-11-14 DIAGNOSIS — Z3493 Encounter for supervision of normal pregnancy, unspecified, third trimester: Principal | ICD-10-CM

## 2023-11-14 DIAGNOSIS — O479 False labor, unspecified: Secondary | ICD-10-CM | POA: Diagnosis not present

## 2023-11-14 LAB — COMPREHENSIVE METABOLIC PANEL WITH GFR
ALT: 10 U/L (ref 0–44)
AST: 19 U/L (ref 15–41)
Albumin: 2.8 g/dL — ABNORMAL LOW (ref 3.5–5.0)
Alkaline Phosphatase: 109 U/L (ref 38–126)
Anion gap: 12 (ref 5–15)
BUN: 8 mg/dL (ref 6–20)
CO2: 15 mmol/L — ABNORMAL LOW (ref 22–32)
Calcium: 8.5 mg/dL — ABNORMAL LOW (ref 8.9–10.3)
Chloride: 107 mmol/L (ref 98–111)
Creatinine, Ser: 0.62 mg/dL (ref 0.44–1.00)
GFR, Estimated: >60 mL/min (ref >60)
Glucose, Bld: 72 mg/dL (ref 70–99)
Potassium: 3.7 mmol/L (ref 3.5–5.1)
Sodium: 134 mmol/L — ABNORMAL LOW (ref 135–145)
Total Bilirubin: 0.4 mg/dL (ref 0.0–1.2)
Total Protein: 6 g/dL — ABNORMAL LOW (ref 6.5–8.1)

## 2023-11-14 LAB — TYPE AND SCREEN
ABO/RH(D): O POS
Antibody Screen: NEGATIVE

## 2023-11-14 LAB — CBC
HCT: 32.6 % — ABNORMAL LOW (ref 36.0–46.0)
Hemoglobin: 10.6 g/dL — ABNORMAL LOW (ref 12.0–15.0)
MCH: 28.9 pg (ref 26.0–34.0)
MCHC: 32.5 g/dL (ref 30.0–36.0)
MCV: 88.8 fL (ref 80.0–100.0)
Platelets: 235 K/uL (ref 150–400)
RBC: 3.67 MIL/uL — ABNORMAL LOW (ref 3.87–5.11)
RDW: 14.3 % (ref 11.5–15.5)
WBC: 17.6 K/uL — ABNORMAL HIGH (ref 4.0–10.5)
nRBC: 0 % (ref 0.0–0.2)

## 2023-11-14 MED ORDER — LIDOCAINE HCL (PF) 1 % IJ SOLN: 30.0000 mL | INTRAMUSCULAR | Status: DC | PRN

## 2023-11-14 MED ORDER — OXYTOCIN BOLUS FROM INFUSION
333.0000 mL | Freq: Once | INTRAVENOUS | Status: AC
  Administered 2023-11-15: 333 mL via INTRAVENOUS

## 2023-11-14 MED ORDER — PHENYLEPHRINE 80 MCG/ML (10ML) SYRINGE FOR IV PUSH (FOR BLOOD PRESSURE SUPPORT)
80.0000 ug | PREFILLED_SYRINGE | INTRAVENOUS | Status: DC | PRN
  Administered 2023-11-15: 80 ug via INTRAVENOUS
  Filled 2023-11-14: qty 10

## 2023-11-14 MED ORDER — DIPHENHYDRAMINE HCL 50 MG/ML IJ SOLN: 12.5000 mg | INTRAMUSCULAR | Status: DC | PRN

## 2023-11-14 MED ORDER — EPHEDRINE 5 MG/ML INJ: 10.0000 mg | INTRAVENOUS | Status: DC | PRN

## 2023-11-14 MED ORDER — LACTATED RINGERS IV SOLN
500.0000 mL | INTRAVENOUS | Status: DC | PRN
  Administered 2023-11-14: 1000 mL via INTRAVENOUS
  Administered 2023-11-15: 500 mL via INTRAVENOUS

## 2023-11-14 MED ORDER — LACTATED RINGERS IV SOLN: INTRAVENOUS | Status: DC

## 2023-11-14 MED ORDER — CEFAZOLIN SODIUM-DEXTROSE 2-4 GM/100ML-% IV SOLN
2.0000 g | Freq: Once | INTRAVENOUS | Status: AC
  Administered 2023-11-14: 2 g via INTRAVENOUS
  Filled 2023-11-14: qty 100

## 2023-11-14 MED ORDER — ACETAMINOPHEN 325 MG PO TABS: 650.0000 mg | ORAL_TABLET | ORAL | Status: DC | PRN

## 2023-11-14 MED ORDER — OXYTOCIN-SODIUM CHLORIDE 30-0.9 UT/500ML-% IV SOLN: 2.5000 [IU]/h | INTRAVENOUS | Status: DC

## 2023-11-14 MED ORDER — FENTANYL-BUPIVACAINE-NACL 0.5-0.125-0.9 MG/250ML-% EP SOLN
12.0000 mL/h | EPIDURAL | Status: DC | PRN
  Administered 2023-11-14: 12 mL/h via EPIDURAL
  Filled 2023-11-14: qty 250

## 2023-11-14 MED ORDER — ONDANSETRON HCL 4 MG/2ML IJ SOLN
4.0000 mg | Freq: Four times a day (QID) | INTRAMUSCULAR | Status: DC | PRN
  Administered 2023-11-15 (×2): 4 mg via INTRAVENOUS
  Filled 2023-11-14 (×2): qty 2

## 2023-11-14 MED ORDER — TRANEXAMIC ACID-NACL 1000-0.7 MG/100ML-% IV SOLN
1000.0000 mg | Freq: Once | INTRAVENOUS | Status: AC
  Administered 2023-11-15: 1000 mg via INTRAVENOUS
  Filled 2023-11-14: qty 100

## 2023-11-14 MED ORDER — LIDOCAINE-EPINEPHRINE (PF) 1.5 %-1:200000 IJ SOLN
INTRAMUSCULAR | Status: DC | PRN
  Administered 2023-11-14: 5 mL via EPIDURAL

## 2023-11-14 MED ORDER — SOD CITRATE-CITRIC ACID 500-334 MG/5ML PO SOLN: 30.0000 mL | ORAL | Status: DC | PRN

## 2023-11-14 MED ORDER — PHENYLEPHRINE 80 MCG/ML (10ML) SYRINGE FOR IV PUSH (FOR BLOOD PRESSURE SUPPORT): 80.0000 ug | PREFILLED_SYRINGE | INTRAVENOUS | Status: DC | PRN

## 2023-11-14 MED ORDER — CEFAZOLIN SODIUM-DEXTROSE 1-4 GM/50ML-% IV SOLN
1.0000 g | Freq: Three times a day (TID) | INTRAVENOUS | Status: DC
  Administered 2023-11-15 (×2): 1 g via INTRAVENOUS
  Filled 2023-11-14 (×3): qty 50

## 2023-11-14 MED ORDER — EPHEDRINE 5 MG/ML INJ
10.0000 mg | INTRAVENOUS | Status: DC | PRN
Start: 2023-11-14 — End: 2023-11-15

## 2023-11-14 MED ORDER — LACTATED RINGERS IV SOLN
500.0000 mL | Freq: Once | INTRAVENOUS | Status: AC
  Administered 2023-11-14: 500 mL via INTRAVENOUS

## 2023-11-14 MED ORDER — LIDOCAINE HCL (PF) 1 % IJ SOLN
INTRAMUSCULAR | Status: DC | PRN
  Administered 2023-11-14: 5 mL via EPIDURAL

## 2023-11-14 MED ORDER — FENTANYL CITRATE (PF) 100 MCG/2ML IJ SOLN
100.0000 ug | INTRAMUSCULAR | Status: DC | PRN
  Administered 2023-11-14 (×2): 100 ug via INTRAVENOUS
  Filled 2023-11-14 (×2): qty 2

## 2023-11-14 NOTE — Plan of Care (Signed)

## 2023-11-14 NOTE — MAU Note (Signed)
 Pt instructed to walk for 1 hour and return to room at 0910.

## 2023-11-14 NOTE — Discharge Instructions (Signed)
 Reasons to return to MAU at [facility]:  1.  Contractions are  5 minutes apart or less, each last 1 minute, these have been going on for 1-2 hours, and you cannot walk or talk during them 2.  You have a large gush of fluid, or a trickle of fluid that will not stop and you have to wear a pad 3.  You have bleeding that is bright red, heavier than spotting--like menstrual bleeding (spotting can be normal in early labor or after a check of your cervix) 4.  You do not feel the baby moving like he/she normally does   The [employer] ([email or web address])  This circuit takes at least 90 minutes to complete so clear your schedule and make mental preparations so you can relax in your environment.  The second step requires a lot of pillows so gather them up before beginning.  Before starting, you should empty your bladder! Have a nice drink nearby, and make sure it has a straw! If you are having contractions, this circuit should be done through contractions, try not to change positions between steps.  Step One: Open Knee Chest  Stay in this position for 30 minutes, start in cat/cow (on your hands and knees), then drop your chest as low as you can to the bed or the floor and your bottom as high as you can. Knees should be fairly wide apart, and the angle between the torso/thighs should be wider than 90 degrees. Wiggle around, prop with lots of pillows and use this time to get totally relaxed. This position allows the baby to scoot out of the pelvis a bit and gives them room to rotate, shift his/her head position, etc. If it is helpful, a support person can provide careful positioning with a rebozo/sheet under the belly, to help maintain this position for the full 30 minutes.   Step Utn:Zkjhhzmjuzi Side Lying  Roll to your left side, bringing your top leg as high as possible and keeping your bottom leg straight. Roll forward as much as possible, again  using a lot of pillows. Sink into the bed and relax some more. If you fall asleep, that's totally okay and you can stay there! If not, stay here for at least another half an hour. Try and get your top right leg up towards your head and get as rolled over onto your belly as much as possible. If you repeat the circuit during labor, try alternating left and right sides.   Step Three: Moving and Lunges  Lunge, walk stairs facing sideways, 2 at a time, (have a spotter downstairs of you!), take a walk outside with one foot on the curb and the other on the street, sit on a birth ball and hula- anything that's upright and putting your pelvis in open, asymmetrical positions. Spend at least 30 minutes doing this one as well to give your baby a chance to move down. If you are lunging or stair or curb walking, you should lunge/walk/go up stairs in the direction that feels better to you. The key with the lunge is to turn the toes out, at a right angle to your belly button.  Do not lunge with toes pointed forward, over your knee, as that closes the pelvis.  Have fun, and relax!

## 2023-11-14 NOTE — Anesthesia Procedure Notes (Signed)
 Epidural Patient location during procedure: OB Start time: 11/14/2023 11:21 PM End time: 11/14/2023 11:31 PM  Staffing Anesthesiologist: Patrisha Bernardino SQUIBB, MD Performed: anesthesiologist   Preanesthetic Checklist Completed: patient identified, IV checked, site marked, risks and benefits discussed, monitors and equipment checked, pre-op evaluation and timeout performed  Epidural Patient position: sitting Prep: DuraPrep Patient monitoring: heart rate, cardiac monitor, continuous pulse ox and blood pressure Approach: midline Location: L4-L5 Injection technique: LOR air  Needle:  Needle type: Tuohy  Needle gauge: 17 G Needle length: 9 cm Needle insertion depth: 5 cm Catheter type: closed end flexible Catheter size: 19 Gauge Catheter at skin depth: 10 cm Test dose: negative and 1.5% lidocaine  with Epi 1:200 K  Assessment Events: blood not aspirated, no cerebrospinal fluid, injection not painful, no injection resistance and negative IV test  Additional Notes Informed consent obtained prior to proceeding including risk of failure, 1% risk of PDPH, risk of minor discomfort and bruising. Discussed alternatives to epidural analgesia and patient desires to proceed.  Timeout performed pre-procedure verifying patient name, procedure, and platelet count.  Patient tolerated procedure well. Reason for block:procedure for pain

## 2023-11-14 NOTE — H&P (Signed)
 [name] is a 28 y.o. female presenting for labor. Was seen in MAU this morning for labor check but cervic was 2 cm and did not change. Returned to MAU is severe pain w/ contractions. Tearful, not coping well. Cervix still 2 cm. Is scheduled for midnight induction tonight. Concerned that pt is multip and GBS pos and may miss adequate GBS prophylaxis if sent home. Bed not available in L&D right now but will give IV pain meds and start GBS prophylaxis.    NURSING  PROVIDER  Cons[employer]ator, [facility] Dating by lmp  [facility] Model Traditional Anatomy U/S normal  Initiated care at  14wks                Language  English              LAB RESULTS   Support Person Brandon( FOB) Genetics NIPS: LRM AFP: normal    NT/IT (FT only)     Carrier Screen Horizon: Nml  Rhogam  O/Positive/-- (03/17 1605) A1C/GTT Early HgbA1C: 4.9 Third trimester 2 hr GTT: normal  Flu Vaccine     TDaP Vaccine  Given 08/08/23 Blood Type O/Positive/-- (03/17 1605)  RSV Vaccine  Antibody Negative (03/17 1605)  COVID Vaccine No Rubella 6.01 (03/17 1605)  Feeding Plan both RPR Non Reactive (06/02 0846)  Contraception Considering Depo HBsAg Negative (03/17 1605)  Circumcision Yes HIV Non Reactive (06/02 0846)  Pediatrician  Elveria Ester HCVAb Non Reactive (03/17 1605)  Prenatal Classes     BTL Consent 28 wks Pap Diagnosis  Date Value Ref Range Status  05/27/2023   Final   - Negative for intraepithelial lesion or malignancy (NILM)    BTL Pre-payment na GC/CT Initial:   36wks: neg    VBAC Consent na GBS Positive/-- (08/05 9075) For PCN allergy, check sensitivities   BRx Optimized? [ ]  yes   [ ]  no    DME Rx [ x] BP cuff [ ]  Weight Scale Waterbirth  [ ]  Class [ ]  Consent [ ]  CNM visit  PHQ9 & GAD7 [  ] new OB [  ] 28 weeks  [  ] 36 weeks Induction  [ ]  Orders Entered [ ] Foley Y/N    OB History     Gravida  4   Para  2   Term  2   Preterm  0   AB  1   Living  2      SAB  1   IAB  0   Ectopic   0   Multiple  0   Live Births  2          Past Medical History:  Diagnosis Date   Acid reflux    Anxiety    Asthma    Headache    Labor and delivery indication for care or intervention 06/06/2017   Limited prenatal care in third trimester 03/27/2017   Started care at 26 wks     Supervision of high risk pregnancy, antepartum 03/20/2017            Clinic     [facility]    Prenatal Labs      Dating     unsure LMP --- updated by 3rd trimester u/s    Blood type:   O positive      Genetic Screen    1 Screen:    AFP:     Quad: late to care    NIPS:    Antibody:  negative      Anatomic US      female, limited views of heart & spine  [x ] f/u in 4 wks - normal, mostly complete, no additional US  indicated  EDD updated    Rubella:  immune      G   SVD (spontaneous vaginal delivery) 06/07/2017   Please schedule this patient for PP visit in: 4 weeks  Low risk pregnancy complicated by: drug use, limited PNC, incarceration  Delivery mode:  SVD  Anticipated Birth Control:  IUD  PP Procedures needed: none   Schedule Integrated BH visit: no  Provider: either     Past Surgical History:  Procedure Laterality Date   FRACTURE SURGERY     ORTHOPEDIC SURGERY     Family History: family history includes Asthma in her mother and sister; COPD in her mother; Heart disease in her father; Kidney disease in her father; Thyroid disease in her mother. Social History:  reports that she has been smoking cigarettes. She started smoking about 9 months ago. She has a 3.4 pack-year smoking history. She has never used smokeless tobacco. She reports current drug use. Drug: Marijuana. She reports that she does not drink alcohol.     Maternal Diabetes: No Genetic Screening: Normal Maternal Ultrasounds/Referrals: Normal Fetal Ultrasounds or other Referrals:  None Maternal Substance Abuse:  Yes:  Type: Smoker, Marijuana Significant Maternal Medications:  None Significant Maternal Lab Results:  Group B Strep positive Number  of Prenatal Visits:greater than 3 verified prenatal visits Maternal Vaccinations:TDap Other Comments:  Remote Hx Cocaine use.   Review of Systems  Constitutional:  Negative for chills and fever.  Eyes:  Negative for visual disturbance.  Gastrointestinal:  Positive for abdominal pain. Negative for nausea and vomiting.  Genitourinary:  Negative for vaginal bleeding and vaginal discharge.  Neurological:  Negative for headaches.   Maternal Medical History:  Reason for admission: Contractions.  Nausea.  Contractions: Frequency: regular.   Perceived severity is strong.   Fetal activity: Perceived fetal activity is normal.   Prenatal complications: No PIH or IUGR.   Prenatal Complications - Diabetes: none.   Dilation: 2 Effacement (%): 50 Station: Ballotable Exam by:: Lorice Montenegro RN Blood pressure 112/60, pulse 84, temperature 98.4 F (36.9 C), temperature source Oral, resp. rate 18, height 5' 2 (1.575 m), weight 65.7 kg, last menstrual period 02/12/2023, SpO2 99%. Maternal Exam:  Uterine Assessment: Contraction strength is firm.  Contraction frequency is regular.  Abdomen: Patient reports no abdominal tenderness. Estimated fetal weight is 8-7.   Fetal presentation: vertex Introitus: Vulva is negative for lesion.  Vagina is negative for discharge.  Cervix: Cervix evaluated by digital exam.     Physical Exam Constitutional:      General: She is in acute distress.     Appearance: She is well-developed. She is not toxic-appearing.  HENT:     Head: Normocephalic.  Eyes:     Conjunctiva/sclera: Conjunctivae normal.  Cardiovascular:     Rate and Rhythm: Normal rate and regular rhythm.     Heart sounds: Normal heart sounds.  Pulmonary:     Effort: Pulmonary effort is normal. No respiratory distress.     Breath sounds: Normal breath sounds.  Abdominal:     Palpations: Abdomen is soft.     Tenderness: There is no abdominal tenderness.  Genitourinary: Vulva is no lesion.      Vagina: No vaginal discharge or bleeding.  Musculoskeletal:        General: Normal range of motion.  Cervical back: Normal range of motion and neck supple.     Right lower leg: No edema.     Left lower leg: No edema.  Skin:    General: Skin is warm and dry.  Neurological:     Mental Status: She is alert and oriented to person, place, and time.  Psychiatric:        Mood and Affect: Mood normal.     Prenatal labs: ABO, Rh: O/Positive/-- (03/17 1605) Antibody: Negative (03/17 1605) Rubella: 6.01 (03/17 1605) RPR: Non Reactive (06/02 0846)  HBsAg: Negative (03/17 1605)  HIV: Non Reactive (06/02 0846)  GBS: Positive/-- (08/05 0924)   10/29/23: Est. FW: 3829 gm 8 lb 7 oz 85 %   Assessment: 1. Labor: Early 2. Fetal Wellbeing: Category I  3. Pain Control: Fentanyl  4. GBS: pos 5. 41.0 week IUP  Plan:  1. Admit to BS per consult with MD 2. Routine L&D orders 3. Analgesia/anesthesia PRN  4. Ancef   Zavien Clubb  Claudene 11/14/2023, 3:15 PM

## 2023-11-14 NOTE — MAU Note (Signed)
 [name] is a 28 y.o. at [redacted]w[redacted]d here in MAU reporting: she's having ctxs that have 5 minutes apart since 1230 this afternoon.  Denies moderate VB, scant amount from cervical checks earlier today.  Denies LOF.  Reports +FM.  LMP: 02/12/2023 Onset of complaint: today Pain score: 10 Vitals:   11/14/23 1418  BP: 112/60  Pulse: 84  Resp: 18  Temp: 98.4 F (36.9 C)  SpO2: 99%     FHT: 122 bpm  Lab orders placed from triage: None

## 2023-11-14 NOTE — Anesthesia Preprocedure Evaluation (Signed)
 Anesthesia Evaluation  Patient identified by MRN, date of birth, ID band Patient awake    Reviewed: Allergy & Precautions, H&P , NPO status , Patient's Chart, lab work & pertinent test results  History of Anesthesia Complications Negative for: history of anesthetic complications  Airway Mallampati: II       Dental no notable dental hx.    Pulmonary asthma , Current Smoker and Patient abstained from smoking.   Pulmonary exam normal        Cardiovascular negative cardio ROS Normal cardiovascular exam     Neuro/Psych  Headaches  Anxiety        GI/Hepatic Neg liver ROS,GERD  ,,  Endo/Other  negative endocrine ROS    Renal/GU negative Renal ROS  negative genitourinary   Musculoskeletal   Abdominal   Peds  Hematology  (+) Blood dyscrasia, anemia PLT: 235   Anesthesia Other Findings   Reproductive/Obstetrics (+) Pregnancy                              Anesthesia Physical Anesthesia Plan  ASA: 2  Anesthesia Plan: Epidural   Post-op Pain Management:    Induction:   PONV Risk Score and Plan:   Airway Management Planned:   Additional Equipment:   Intra-op Plan:   Post-operative Plan:   Informed Consent: I have reviewed the patients History and Physical, chart, labs and discussed the procedure including the risks, benefits and alternatives for the proposed anesthesia with the patient or authorized representative who has indicated his/her understanding and acceptance.       Plan Discussed with:   Anesthesia Plan Comments:         Anesthesia Quick Evaluation

## 2023-11-14 NOTE — MAU Note (Signed)
 ZAURIA DOMBEK is a 28 y.o. at [redacted]w[redacted]d here in MAU reporting: ctx since midnight - they were every 5 minutes but now they spaced out to every 10. Denies VB or LOF - states she lost her mucous plug yesterday. +FM   LMP: NA Onset of complaint: 0000 Pain score: 8 Vitals:   11/14/23 0652  BP: 114/82  Pulse: 68  Resp: 18  Temp: 98.6 F (37 C)  SpO2: 99%     FHT: 134  Lab orders placed from triage: LABOR EVAL

## 2023-11-15 ENCOUNTER — Inpatient Hospital Stay (HOSPITAL_COMMUNITY): Admission: RE | Admit: 2023-11-15 | Source: Home / Self Care | Admitting: Family Medicine

## 2023-11-15 ENCOUNTER — Encounter (HOSPITAL_COMMUNITY): Payer: Self-pay | Admitting: Family Medicine

## 2023-11-15 ENCOUNTER — Inpatient Hospital Stay (HOSPITAL_COMMUNITY)

## 2023-11-15 DIAGNOSIS — O99324 Drug use complicating childbirth: Secondary | ICD-10-CM

## 2023-11-15 DIAGNOSIS — O480 Post-term pregnancy: Secondary | ICD-10-CM

## 2023-11-15 DIAGNOSIS — O99334 Smoking (tobacco) complicating childbirth: Secondary | ICD-10-CM

## 2023-11-15 DIAGNOSIS — O99820 Streptococcus B carrier state complicating pregnancy: Secondary | ICD-10-CM

## 2023-11-15 DIAGNOSIS — Z3A41 41 weeks gestation of pregnancy: Secondary | ICD-10-CM

## 2023-11-15 LAB — RPR: RPR Ser Ql: NONREACTIVE

## 2023-11-15 MED ORDER — WITCH HAZEL-GLYCERIN EX PADS: 1.0000 | MEDICATED_PAD | CUTANEOUS | Status: DC | PRN

## 2023-11-15 MED ORDER — SIMETHICONE 80 MG PO CHEW: 80.0000 mg | CHEWABLE_TABLET | ORAL | Status: DC | PRN

## 2023-11-15 MED ORDER — ONDANSETRON HCL 4 MG/2ML IJ SOLN: 4.0000 mg | INTRAMUSCULAR | Status: DC | PRN

## 2023-11-15 MED ORDER — BENZOCAINE-MENTHOL 20-0.5 % EX AERO: 1.0000 | INHALATION_SPRAY | CUTANEOUS | Status: DC | PRN

## 2023-11-15 MED ORDER — OXYTOCIN-SODIUM CHLORIDE 30-0.9 UT/500ML-% IV SOLN
1.0000 m[IU]/min | INTRAVENOUS | Status: DC
  Administered 2023-11-15: 2 m[IU]/min via INTRAVENOUS
  Filled 2023-11-15: qty 500

## 2023-11-15 MED ORDER — DIPHENHYDRAMINE HCL 25 MG PO CAPS: 25.0000 mg | ORAL_CAPSULE | Freq: Four times a day (QID) | ORAL | Status: DC | PRN

## 2023-11-15 MED ORDER — IBUPROFEN 600 MG PO TABS
600.0000 mg | ORAL_TABLET | Freq: Four times a day (QID) | ORAL | Status: DC
  Administered 2023-11-15 – 2023-11-16 (×4): 600 mg via ORAL
  Filled 2023-11-15 (×3): qty 1

## 2023-11-15 MED ORDER — TETANUS-DIPHTH-ACELL PERTUSSIS 5-2.5-18.5 LF-MCG/0.5 IM SUSY: 0.5000 mL | PREFILLED_SYRINGE | Freq: Once | INTRAMUSCULAR | Status: DC

## 2023-11-15 MED ORDER — COCONUT OIL OIL: 1.0000 | TOPICAL_OIL | Status: DC | PRN

## 2023-11-15 MED ORDER — PRENATAL MULTIVITAMIN CH
1.0000 | ORAL_TABLET | Freq: Every day | ORAL | Status: DC
  Administered 2023-11-16: 1 via ORAL
  Filled 2023-11-15: qty 1

## 2023-11-15 MED ORDER — DIBUCAINE (PERIANAL) 1 % EX OINT: 1.0000 | TOPICAL_OINTMENT | CUTANEOUS | Status: DC | PRN

## 2023-11-15 MED ORDER — ACETAMINOPHEN 325 MG PO TABS
650.0000 mg | ORAL_TABLET | ORAL | Status: DC | PRN
  Administered 2023-11-15 (×2): 650 mg via ORAL
  Filled 2023-11-15 (×2): qty 2

## 2023-11-15 MED ORDER — SENNOSIDES-DOCUSATE SODIUM 8.6-50 MG PO TABS
2.0000 | ORAL_TABLET | Freq: Every day | ORAL | Status: DC
  Administered 2023-11-16: 2 via ORAL
  Filled 2023-11-15: qty 2

## 2023-11-15 MED ORDER — TERBUTALINE SULFATE 1 MG/ML IJ SOLN: 0.2500 mg | Freq: Once | INTRAMUSCULAR | Status: DC | PRN

## 2023-11-15 MED ORDER — ONDANSETRON HCL 4 MG PO TABS: 4.0000 mg | ORAL_TABLET | ORAL | Status: DC | PRN

## 2023-11-15 MED ORDER — ZOLPIDEM TARTRATE 5 MG PO TABS: 5.0000 mg | ORAL_TABLET | Freq: Every evening | ORAL | Status: DC | PRN

## 2023-11-15 NOTE — Discharge Summary (Signed)
 Postpartum Discharge Summary  Patient Name: [name] DOB: 13-Jan-1996 MRN: [id number]  Date of admission: 11/14/2023 Delivery date:11/15/2023 Delivering provider: PAYNE, SHANTONETTE M Date of discharge: 11/16/2023  Admitting diagnosis: Post-dates pregnancy [O48.0] Intrauterine pregnancy: [redacted]w[redacted]d     Secondary diagnosis:  Active Problems:   SVD (spontaneous vaginal delivery)  Additional problems:  Patient Active Problem List   Diagnosis Date Noted   SVD (spontaneous vaginal delivery) 11/16/2023   Tobacco smoking affecting pregnancy in third trimester 05/27/2023   Supervision of low-risk pregnancy 05/22/2023   History of postpartum hemorrhage, currently pregnant 06/07/2017   Marijuana use 06/06/2017   History of drug use 03/23/2017       Discharge diagnosis: Term Pregnancy Delivered and Tobacco Dependence                                              Post partum procedures:none Augmentation: Pitocin  and AROM  Complications: None  Hospital course: Onset of Labor With Vaginal Delivery      28 y.o. yo [id number] at [redacted]w[redacted]d was admitted in Latent Labor on 11/14/2023. Labor course was complicated by mec stained fluid   Membrane Rupture Time/Date: 4:00 AM,11/15/2023  Delivery Method:Vaginal, Spontaneous Operative Delivery:N/A Episiotomy: None Lacerations:  2nd degree Patient had an uncomplicated postpartum course.  She is ambulating, tolerating a regular diet, passing flatus, and urinating well. Patient is discharged home in stable condition on 11/16/23.  Newborn Data: Birth date:11/15/2023 Birth time:12:38 PM Gender:Female Living status:Living Apgars:7 ,8  Weight:3810 g  Magnesium Sulfate received: No BMZ received: No Rhophylac:N/A MMR:N/A T-DaP:Given prenatally Flu: No RSV Vaccine received: No Transfusion:No  Immunizations received: Immunization History  Administered Date(s) Administered   Tdap 12/24/2015, 04/03/2017, 08/08/2023    Physical exam  Vitals:   11/15/23  1600 11/15/23 2001 11/15/23 2359 11/16/23 0454  BP: 110/70 111/70 114/75 92/69  Pulse: 79 73 71 73  Resp: 18 18 18 18   Temp: 98 F (36.7 C) 98.2 F (36.8 C) 98.2 F (36.8 C)   TempSrc: Oral Axillary Oral   SpO2: 100% 98% 100% 97%  Weight:      Height:       General: alert, cooperative, and no distress Lochia: appropriate Uterine Fundus: firm Incision: N/A DVT Evaluation: No significant calf/ankle edema. Labs: Lab Results  Component Value Date   WBC 15.6 (H) 11/16/2023   HGB 9.1 (L) 11/16/2023   HCT 27.5 (L) 11/16/2023   MCV 88.1 11/16/2023   PLT 193 11/16/2023      Latest Ref Rng & Units 11/14/2023    3:17 PM  CMP  Glucose 70 - 99 mg/dL 72   BUN 6 - 20 mg/dL 8   Creatinine 9.55 - 8.99 mg/dL 9.37   Sodium 864 - 854 mmol/L 134   Potassium 3.5 - 5.1 mmol/L 3.7   Chloride 98 - 111 mmol/L 107   CO2 22 - 32 mmol/L 15   Calcium 8.9 - 10.3 mg/dL 8.5   Total Protein 6.5 - 8.1 g/dL 6.0   Total Bilirubin 0.0 - 1.2 mg/dL 0.4   Alkaline Phos 38 - 126 U/L 109   AST 15 - 41 U/L 19   ALT 0 - 44 U/L 10    Edinburgh Score:    11/15/2023    5:00 PM  Edinburgh Postnatal Depression Scale Screening Tool  I have been able to laugh and see  the funny side of things. 0  I have looked forward with enjoyment to things. 0  I have blamed myself unnecessarily when things went wrong. 0  I have been anxious or worried for no good reason. 0  I have felt scared or panicky for no good reason. 0  Things have been getting on top of me. 0  I have been so unhappy that I have had difficulty sleeping. 0  I have felt sad or miserable. 0  I have been so unhappy that I have been crying. 0  The thought of harming myself has occurred to me. 0  Edinburgh Postnatal Depression Scale Total 0   Edinburgh Postnatal Depression Scale Total: 0   After visit meds:  Allergies as of 11/16/2023       Reactions   Sulfamethoxazole -trimethoprim  Hives, Itching, Nausea And Vomiting   Penicillins Other (See Comments)    ABLE TO TAKE CEHPALOSPORINS Has patient had a PCN reaction causing immediate rash, facial/tongue/throat swelling, SOB or lightheadedness with hypotension: Unknown Has patient had a PCN reaction causing severe rash involving mucus membranes or skin necrosis: Unknown Has patient had a PCN reaction that required hospitalization: Unknown Has patient had a PCN reaction occurring within the last 10 years: Unknown If all of the above answers are NO, then may proceed with Cephalosporin use.        Medication List     STOP taking these medications    calcium carbonate 750 MG chewable tablet Commonly known as: TUMS EX   pantoprazole  20 MG tablet Commonly known as: Protonix    promethazine  12.5 MG tablet Commonly known as: PHENERGAN        TAKE these medications    acetaminophen  500 MG tablet Commonly known as: TYLENOL  Take 2 tablets (1,000 mg total) by mouth every 6 (six) hours as needed for moderate pain (pain score 4-6) or mild pain (pain score 1-3). What changed: reasons to take this   ferrous sulfate  325 (65 FE) MG EC tablet Take 1 tablet (325 mg total) by mouth daily with breakfast.   ibuprofen  600 MG tablet Commonly known as: ADVIL  Take 1 tablet (600 mg total) by mouth every 6 (six) hours.   nicotine  14 mg/24hr patch Commonly known as: NICODERM CQ  - dosed in mg/24 hours Place 1 patch (14 mg total) onto the skin daily.   PrePLUS 27-1 MG Tabs Take 1 tablet by mouth daily.         Discharge home in stable condition Infant Feeding: Bottle and Breast Infant Disposition:home with mother Discharge instruction: per After Visit Summary and Postpartum booklet. Activity: Advance as tolerated. Pelvic rest for 6 weeks.  Diet: routine diet Future Appointments:No future appointments. Follow up Visit: Appt message [facility] to [facility] on 9/6  Please schedule this patient for a In person postpartum visit in 6 weeks with the following provider: Any provider. Additional Postpartum  F/U:n/a   Low risk pregnancy complicated by: N/A  Delivery mode:  Vaginal, Spontaneous Anticipated Birth Control:  PP Depo given  5 minutes of tobacco cessation counseling given to patient prior to discharge. Discussed benefits to patient and her children. Patient is pre-contemplative.  11/16/2023 Leeroy KATHEE Pouch, MD

## 2023-11-15 NOTE — Progress Notes (Signed)
 Patient Vitals for the past 4 hrs:  BP Temp Temp src Pulse Resp SpO2  11/15/23 0000 113/62 -- -- 85 -- 99 %  11/14/23 2345 (!) 108/59 -- -- 74 -- 97 %  11/14/23 2340 (!) 98/56 -- -- 84 -- 98 %  11/14/23 2335 (!) 109/54 -- -- 88 -- 98 %  11/14/23 2330 (!) 109/58 -- -- 100 -- 99 %  11/14/23 2325 116/63 -- -- 98 -- 98 %  11/14/23 2320 114/61 -- -- 78 -- 97 %  11/14/23 2315 (!) 108/50 -- -- 76 -- 96 %  11/14/23 2208 -- -- -- -- -- 100 %  11/14/23 2200 -- -- -- -- -- 100 %  11/14/23 2153 114/72 97.7 F (36.5 C) Oral 76 18 --   Comfortable w/epidural.  FHR Cat 1.  Ctx still mild and irregular.  Cx 3-4/80/-2.  Will start pitocin .

## 2023-11-15 NOTE — Progress Notes (Signed)
 Patient Vitals for the past 4 hrs:  BP Temp Temp src Pulse Resp  11/15/23 0600 103/65 -- -- 88 --  11/15/23 0530 (!) 87/47 -- -- 78 --  11/15/23 0525 (!) 97/53 -- -- 63 --  11/15/23 0520 (!) 89/47 -- -- 76 --  11/15/23 0516 (!) 97/56 -- -- 69 --  11/15/23 0514 (!) 98/56 -- -- (!) 56 --  11/15/23 0500 (!) 86/49 97.7 F (36.5 C) Oral 79 16  11/15/23 0430 (!) 80/47 -- -- 81 --  11/15/23 0400 (!) 88/42 -- -- 74 --  11/15/23 0330 (!) 83/36 -- -- 81 --  11/15/23 0300 (!) 109/50 -- -- 72 --  11/15/23 0245 (!) 103/56 -- -- 82 --  11/15/23 0230 (!) 103/53 -- -- 73 --  11/15/23 0215 107/67 -- -- 77 --   SROM around 0515, clear fluid.  Cx 5/80/-2.  FHR Cat 1. Ctx q 2 minutes, pitocin  at 53mu/min,  Comfortable w/epidural. Continue present mgt.

## 2023-11-15 NOTE — Progress Notes (Signed)
 LABOR NOTE [name] is a 28 y.o. [phone] at [redacted]w[redacted]d admitted for induction of labor due to postdates  Subjective: Introductions exchanged comfortable with epidural  Objective: BP 90/62   Pulse 87   Temp 98.4 F (36.9 C) (Oral)   Resp 17   Ht 5' 2 (1.575 m)   Wt 65.7 kg   LMP 02/12/2023 (Approximate)   SpO2 100%   BMI 26.50 kg/m  No intake/output data recorded.  FHR baseline 120 bpm, Variability: moderate, Accelerations:present, Decelerations:  Absent Toco: q 5 mins   SVE:   Dilation: 8 Effacement (%): 90 Station: -1 Exam by:: Victoria Brought RN  Pitocin  @ 8 mu/min  Labs: Lab Results  Component Value Date   WBC 17.6 (H) 11/14/2023   HGB 10.6 (L) 11/14/2023   HCT 32.6 (L) 11/14/2023   MCV 88.8 11/14/2023   PLT 235 11/14/2023     Assessment / Plan: IOL d/t postdates, s/p SROM and Pit.   Labor: Continue to titrate pit upwards 2x2 as needed for labor progression Fetal Wellbeing:  Category I Pain Control:  epidural I/D:  Ancef  for GBS+ Anticipated MOD: hopeful for vaginal birth  Victoria CHRISTELLA Cedar MSN, CNM  11/15/2023, 8:47 AM

## 2023-11-15 NOTE — Plan of Care (Signed)

## 2023-11-16 ENCOUNTER — Inpatient Hospital Stay (HOSPITAL_COMMUNITY)

## 2023-11-16 ENCOUNTER — Other Ambulatory Visit (HOSPITAL_COMMUNITY): Payer: Self-pay

## 2023-11-16 LAB — CBC
HCT: 27.5 % — ABNORMAL LOW (ref 36.0–46.0)
Hemoglobin: 9.1 g/dL — ABNORMAL LOW (ref 12.0–15.0)
MCH: 29.2 pg (ref 26.0–34.0)
MCHC: 33.1 g/dL (ref 30.0–36.0)
MCV: 88.1 fL (ref 80.0–100.0)
Platelets: 193 K/uL (ref 150–400)
RBC: 3.12 MIL/uL — ABNORMAL LOW (ref 3.87–5.11)
RDW: 14.5 % (ref 11.5–15.5)
WBC: 15.6 K/uL — ABNORMAL HIGH (ref 4.0–10.5)
nRBC: 0 % (ref 0.0–0.2)

## 2023-11-16 MED ORDER — ACETAMINOPHEN 500 MG PO TABS
1000.0000 mg | ORAL_TABLET | Freq: Four times a day (QID) | ORAL | 0 refills | Status: AC | PRN
  Filled 2023-11-16: qty 60, 8d supply, fill #0

## 2023-11-16 MED ORDER — MEDROXYPROGESTERONE ACETATE 150 MG/ML IM SUSP
150.0000 mg | Freq: Once | INTRAMUSCULAR | Status: AC
  Administered 2023-11-16: 150 mg via INTRAMUSCULAR
  Filled 2023-11-16: qty 1

## 2023-11-16 MED ORDER — IBUPROFEN 600 MG PO TABS
600.0000 mg | ORAL_TABLET | Freq: Four times a day (QID) | ORAL | 0 refills | Status: AC
  Filled 2023-11-16: qty 30, 8d supply, fill #0

## 2023-11-16 MED ORDER — FERROUS SULFATE 325 (65 FE) MG PO TABS
325.0000 mg | ORAL_TABLET | Freq: Every day | ORAL | 0 refills | Status: DC
  Filled 2023-11-16: qty 90, 90d supply, fill #0

## 2023-11-16 NOTE — Lactation Note (Signed)
 This note was copied from a baby's chart. Lactation Consultation Note  Patient Name: [name] Unijb'd Date: 11/16/2023 Age:28 years Reason for consult: Initial assessment;1st time breastfeeding;Term;Exclusive pumping and bottle feeding  P3- [name]'s feeding plan is to offer both breast milk and formula. [name] would like to exclusively pump and bottle feed. This is [name]'s first time providing breast milk to one of her children. [name] reports that she attempted to latch infant shortly after delivery, but he wasn't interested. [name] does not plan to attempt to latch him again. [name] denies having any questions or concerns at this time. [name] declined being set up with a DEBP at this time.   LC reviewed the first 24 hr birthday nap, day 2 cluster feeding, feeding infant on cue 8-12x in 24 hrs, not allowing infant to go over 3 hrs without a feeding, CDC milk storage guidelines, LC services handout and engorgement/breast care. LC encouraged [name] to call for further assistance as needed.  Maternal Data Has patient been taught Hand Expression?: No Does the patient have breastfeeding experience prior to this delivery?: No  Feeding Mother's Current Feeding Choice: Breast Milk and Formula Nipple Type: Slow - flow  Lactation Tools Discussed/Used Pump Education: Milk Storage  Interventions Interventions: Breast feeding basics reviewed;Education;LC Services brochure  Discharge Discharge Education: Engorgement and breast care;Warning signs for feeding baby Pump: Hands Free;Personal  Consult Status Consult Status: Complete Date: 11/16/23    Recardo Hoit BS, IBCLC 11/16/2023, 8:15 AM

## 2023-11-16 NOTE — Clinical Social Work Maternal (Addendum)
 CLINICAL SOCIAL WORK MATERNAL/CHILD NOTE  Patient Details  Name: [name] MRN: [id number] Date of Birth: 11/19/1995  Date:  08-22-23  Clinical Social Worker Initiating Note:  [name], [place] Date/Time: Initiated:  11/16/23/1413     Child's Name:  [name]   Biological Parents:  Mother, Father (FOB: [name], DOB: 12/10/1980)   Need for Interpreter:  None   Reason for Referral:  Current Substance Use/Substance Use During Pregnancy  , Behavioral Health Concerns   Address:  [address] [place] [address] (Mailing address)  [address] [place], [place] [address] (Physical address)   Phone number:  [phone] (home)     Additional phone number:   Household Members/Support Persons (HM/SP):   Household Member/Support Person 1, Household Member/Support Person 2, Household Member/Support Person 3   HM/SP Name Relationship DOB or Age  HM/SP -1 [name] FOB 12/10/1980  HM/SP -2 [name] Daughter 06/07/2017  HM/SP -3 [name] Daughter 02/03/2020  HM/SP -4        HM/SP -5        HM/SP -6        HM/SP -7        HM/SP -8          Natural Supports (not living in the home):  Extended Family   Professional Supports: None   Employment:     Type of Work: Retail banker   Education:  High school graduate   Homebound arranged:    Surveyor, quantity Resources:  [employer]   Other Resources:  [employer], Sales executive     Cultural/Religious Considerations Which May Impact Care:    Strengths:  Ability to meet basic needs  , Home prepared for child  , Pediatrician chosen   Psychotropic Medications:         Pediatrician:    Armed forces operational officer area  Pediatrician List:   [facility]  [place]    [place]      Pediatrician Fax Number:    Risk Factors/Current Problems:  Substance Use  , Mental Health Concerns     Cognitive State:  Linear  Thinking  , Able to Concentrate  , Alert  , Goal Oriented     Mood/Affect:  Calm  , Comfortable  , Interested  , Relaxed     CSW Assessment: CSW was consulted due to drug exposed newborn and history of anxiety. CSW met with [name] at bedside to complete assessment. When CSW entered room, [name] was observed sitting in hospital bed holding infant. FOB and a family member were present sitting nearby. CSW introduced self and requested to speak with [name] alone. [name] stated, we're all family and provided verbal consent for CSW to continue with FOB and family member present. CSW introduced self and explained reason for consult. [name] presented as calm, was agreeable to consult and remained engaged throughout encounter.   [name] reports she lives at [address] [place], [place] [address] and states the address listed in her chart is her permanent mailing address.   CSW inquired about [name]'s mental health history. [name] acknowledged a history of anxiety, stating that she has not had formal treatment but acknowledged a history of anxiety and reported she began experiencing anxiety symptoms in [employer], marked by feeling nervous in big social settings.. CSW inquired about anxiety symptoms during pregnancy. [name] reports a stable mood during pregnancy, sharing  she handle(s) it well. CSW inquired about a history of postpartum depression. [name] denied a history of ppd. [name] states she is aware of integrated behavioral health therapy support available through her OBGYN and feels comfortable contacting her OBGYN if she is in need of additional support. CSW assessed for safety. [name] denied current SI/HI. Domestic violence was not assessed due to FOB being present.  CSW provided education regarding the baby blues period vs. perinatal mood disorders, discussed treatment and gave resources for mental health follow up if concerns arise.  CSW recommends self-evaluation during the postpartum time period using the New Mom Checklist from  Postpartum Progress and encouraged [name] to contact a medical professional if symptoms are noted at any time.    [name] reports she has all needed items for infant, including a car seat and bassinet.   CSW informed [name] about hospital drug screen policy due to documented use of marijuana during pregnancy. CSW explained that infant's UDS resulted positive for THC, and infant's CDS would be monitored. CSW explained that a CPS report would be made due to infant's +UDS for THC. [name] expressed understanding. CSW inquired about substance use during pregnancy. CSW inquired about prior CPS involvement. [name] reports a CPS case was opened in 2024 due to allegations regarding her oldest daughter's father in [place]. [name] states that the case was closed December 2024.  CSW provided review of Sudden Infant Death Syndrome (SIDS) precautions.    CSW placed call to [facility] Department of Social Services CPS line and made CPS report to social worker [name] due to Infant's +UDS for [facility].   CSW identifies no further need for intervention and no barriers to discharge at this time.   CSW Plan/Description:  No Further Intervention Required/No Barriers to Discharge, Sudden Infant Death Syndrome (SIDS) Education, Perinatal Mood and Anxiety Disorder (PMADs) Education, Hospital Drug Screen Policy Information, Child Protective Service Report  , CSW Will Continue to Monitor Umbilical Cord Tissue Drug Screen Results and Make Report if Warranted    [name], LCSWA 2023/07/30, 2:38 PM

## 2023-11-16 NOTE — Anesthesia Postprocedure Evaluation (Signed)
 Anesthesia Post Note  Patient: [name]  Procedure(s) Performed: AN AD HOC LABOR EPIDURAL     Patient location during evaluation: Mother Baby Anesthesia Type: Epidural Level of consciousness: awake Pain management: satisfactory to patient Vital Signs Assessment: post-procedure vital signs reviewed and stable Respiratory status: spontaneous breathing Cardiovascular status: stable Anesthetic complications: no   No notable events documented.  Last Vitals:  Vitals:   11/15/23 2359 11/16/23 0454  BP: 114/75 92/69  Pulse: 71 73  Resp: 18 18  Temp: 36.8 C   SpO2: 100% 97%    Last Pain:  Vitals:   11/16/23 0740  TempSrc:   PainSc: 0-No pain   Pain Goal:                Epidural/Spinal Function Cutaneous sensation: Normal sensation (11/16/23 0740), Patient able to flex knees: Yes (11/16/23 0740), Patient able to lift hips off bed: Yes (11/16/23 0740), Back pain beyond tenderness at insertion site: No (11/16/23 0740), Progressively worsening motor and/or sensory loss: No (11/16/23 0740), Bowel and/or bladder incontinence post epidural: No (11/16/23 0740)  JEANENNE HANDING

## 2023-11-28 ENCOUNTER — Telehealth (HOSPITAL_COMMUNITY): Payer: Self-pay | Admitting: *Deleted

## 2023-11-28 NOTE — Telephone Encounter (Signed)
 11/28/2023  Name: [name] MRN: [id number] DOB: [birth year]  Reason for Call:  Transition of Care Hospital Discharge Call  Contact Status: Patient Contact Status: Complete  Language assistant needed: Interpreter Mode: Interpreter Not Needed        Follow-Up Questions: Do You Have Any Concerns About Your Health As You Heal From Delivery?: No Do You Have Any Concerns About Your Infants Health?: No  Edinburgh Postnatal Depression Scale:  In the Past 7 Days: I have been able to laugh and see the funny side of things.: As much as I always could I have looked forward with enjoyment to things.: As much as I ever did I have blamed myself unnecessarily when things went wrong.: Not very often I have been anxious or worried for no good reason.: No, not at all I have felt scared or panicky for no good reason.: No, not at all Things have been getting on top of me.: No, I have been coping as well as ever I have been so unhappy that I have had difficulty sleeping.: Not at all I have felt sad or miserable.: No, not at all I have been so unhappy that I have been crying.: No, never The thought of harming myself has occurred to me.: Never [place] Postnatal Depression Scale Total: 1  PHQ2-9 Depression Scale:     Discharge Follow-up: Edinburgh score requires follow up?: No Patient was advised of the following resources:: Support Group, Breastfeeding Support Group (declines postpartum group information via email)  Post-discharge interventions: Reviewed Newborn Safe Sleep Practices  Mliss Sieve, RN 11/28/2023 10:44

## 2023-12-23 ENCOUNTER — Other Ambulatory Visit: Payer: Self-pay

## 2023-12-23 ENCOUNTER — Ambulatory Visit: Admitting: Obstetrics and Gynecology

## 2023-12-23 ENCOUNTER — Encounter: Payer: Self-pay | Admitting: Obstetrics and Gynecology

## 2023-12-23 NOTE — Progress Notes (Signed)
    Post Partum Visit Note  [name] is a 28 y.o. [id number] female who presents for a postpartum visit. She is 5 weeks postpartum following a normal spontaneous vaginal delivery.  I have fully reviewed the prenatal and intrapartum course. The delivery was at 41.1 gestational weeks.  Anesthesia: epidural. Postpartum course has been . Baby is doing well. Baby is feeding by bottle - Similac Advance. Bleeding dark red. Bowel function is normal. Bladder function is normal. Patient is not sexually active. Contraception method is Depo-Provera  injections. Postpartum depression screening: negative.   The pregnancy intention screening data noted above was reviewed. Potential methods of contraception were discussed. The patient elected to proceed with No data recorded.    Health Maintenance Due  Topic Date Due   Pneumococcal Vaccine (1 of 2 - PCV) Never done   Hepatitis B Vaccines 19-59 Average Risk (1 of 3 - 19+ 3-dose series) Never done   HPV VACCINES (1 - 3-dose SCDM series) Never done   Influenza Vaccine  Never done   COVID-19 Vaccine (1 - 2025-26 season) Never done    The following portions of the patient's history were reviewed and updated as appropriate: allergies, current medications, past family history, past medical history, past social history, past surgical history, and problem list.  Review of Systems Pertinent items are noted in HPI.  Objective:  LMP 02/12/2023 (Approximate)    General:  alert and cooperative   Breasts:  N/i  Lungs: Normal effort     Abdomen: nondistended      GU exam:  normal       Assessment:   1. Postpartum exam (Primary) Continue depo, will schedule follow up injections   Plan:   Essential components of care per ACOG recommendations:  1.  Mood and well being: Patient with negative depression screening today. Reviewed local resources for support.  - Patient tobacco use? yes  - hx of drug use? yes    2. Infant care and feeding:  -Patient  currently breastmilk feeding? No.  -Social determinants of health (SDOH) reviewed in EPIC. No concerns  3. Sexuality, contraception and birth spacing - Patient does not want a pregnancy in the next year.   - Reviewed reproductive life planning. Reviewed contraceptive methods based on pt preferences and effectiveness.  Patient desired Hormonal Injection today.   - Discussed birth spacing of 18 months  4. Sleep and fatigue -Encouraged family/partner/community support of 4 hrs of uninterrupted sleep to help with mood and fatigue  5. Physical Recovery  - Discussed patients delivery and complications. She describes her labor as good. - Patient had a Vaginal, no problems at delivery. Patient had a 2nd degree laceration. Perineal healing reviewed. Patient expressed understanding - Patient has urinary incontinence? No. - Patient is safe to resume physical and sexual activity  6.  Health Maintenance - HM due items addressed Yes - Last pap smear  Diagnosis  Date Value Ref Range Status  05/27/2023   Final   - Negative for intraepithelial lesion or malignancy (NILM)   Pap smear not done at today's visit.  -Breast Cancer screening indicated? No.   7. Chronic Disease/Pregnancy Condition follow up: None  - PCP follow up  Nidia Daring, FNP [facility] [employer], [facility]

## 2024-02-03 ENCOUNTER — Ambulatory Visit: Admitting: *Deleted

## 2024-02-03 ENCOUNTER — Other Ambulatory Visit: Payer: Self-pay

## 2024-02-03 VITALS — BP 108/64 | HR 61 | Ht 62.5 in | Wt 130.5 lb

## 2024-02-03 DIAGNOSIS — Z3493 Encounter for supervision of normal pregnancy, unspecified, third trimester: Secondary | ICD-10-CM

## 2024-02-03 DIAGNOSIS — Z3042 Encounter for surveillance of injectable contraceptive: Secondary | ICD-10-CM | POA: Diagnosis not present

## 2024-02-03 MED ORDER — MEDROXYPROGESTERONE ACETATE 150 MG/ML IM SUSY
150.0000 mg | PREFILLED_SYRINGE | Freq: Once | INTRAMUSCULAR | Status: AC
  Administered 2024-02-03: 150 mg via INTRAMUSCULAR

## 2024-02-03 NOTE — Progress Notes (Signed)
 [name] here for Depo-Provera  Injection. Injection administered without complication. Patient will return in 3 months for next injection between 04/20/24 and 05/04/24. Next annual visit due after 12/22/24.  Rock [name] 02/03/2024  10:36 AM

## 2024-04-20 ENCOUNTER — Ambulatory Visit
